# Patient Record
Sex: Female | Born: 1980
Health system: Southern US, Community
[De-identification: ages and names within clinical notes are randomized; demographics above are authoritative.]

## PROBLEM LIST (undated history)

## (undated) DIAGNOSIS — R079 Chest pain, unspecified: Secondary | ICD-10-CM

## (undated) DIAGNOSIS — M549 Dorsalgia, unspecified: Secondary | ICD-10-CM

## (undated) DIAGNOSIS — J45909 Unspecified asthma, uncomplicated: Secondary | ICD-10-CM

## (undated) DIAGNOSIS — M797 Fibromyalgia: Secondary | ICD-10-CM

## (undated) DIAGNOSIS — F329 Major depressive disorder, single episode, unspecified: Secondary | ICD-10-CM

## (undated) DIAGNOSIS — R600 Localized edema: Secondary | ICD-10-CM

## (undated) DIAGNOSIS — E559 Vitamin D deficiency, unspecified: Secondary | ICD-10-CM

## (undated) DIAGNOSIS — F32A Depression, unspecified: Secondary | ICD-10-CM

## (undated) DIAGNOSIS — F419 Anxiety disorder, unspecified: Secondary | ICD-10-CM

## (undated) DIAGNOSIS — F909 Attention-deficit hyperactivity disorder, unspecified type: Secondary | ICD-10-CM

## (undated) DIAGNOSIS — E039 Hypothyroidism, unspecified: Secondary | ICD-10-CM

## (undated) HISTORY — DX: Vitamin D deficiency, unspecified: E55.9

## (undated) HISTORY — DX: Chest pain, unspecified: R07.9

## (undated) HISTORY — DX: Unspecified asthma, uncomplicated: J45.909

## (undated) HISTORY — DX: Dorsalgia, unspecified: M54.9

## (undated) HISTORY — DX: Attention-deficit hyperactivity disorder, unspecified type: F90.9

## (undated) HISTORY — DX: Localized edema: R60.0

## (undated) HISTORY — DX: Fibromyalgia: M79.7

---

## 2004-03-01 ENCOUNTER — Emergency Department (HOSPITAL_COMMUNITY): Admission: EM | Admit: 2004-03-01 | Discharge: 2004-03-01 | Payer: Self-pay | Admitting: Emergency Medicine

## 2007-09-11 ENCOUNTER — Inpatient Hospital Stay (HOSPITAL_COMMUNITY): Admission: AD | Admit: 2007-09-11 | Discharge: 2007-09-11 | Payer: Self-pay | Admitting: Obstetrics & Gynecology

## 2007-10-04 ENCOUNTER — Inpatient Hospital Stay (HOSPITAL_COMMUNITY): Admission: RE | Admit: 2007-10-04 | Discharge: 2007-10-06 | Payer: Self-pay | Admitting: Obstetrics

## 2011-03-10 LAB — CBC
HCT: 28.7 — ABNORMAL LOW
MCHC: 34.3
MCV: 82.4
Platelets: 176
Platelets: 211
RBC: 4.21
RDW: 15.3
RDW: 15.3

## 2011-03-10 LAB — RPR: RPR Ser Ql: NONREACTIVE

## 2011-03-27 ENCOUNTER — Other Ambulatory Visit: Payer: Self-pay | Admitting: Family Medicine

## 2011-03-27 DIAGNOSIS — E010 Iodine-deficiency related diffuse (endemic) goiter: Secondary | ICD-10-CM

## 2011-04-03 ENCOUNTER — Other Ambulatory Visit: Payer: Self-pay

## 2012-06-15 NOTE — L&D Delivery Note (Signed)
Delivery Note At 3:20 AM a viable female was delivered via Vaginal, Spontaneous Delivery (Presentation:direct ; Occiput Anterior).  APGAR: 5, 7; weight 7 lb 11.8 oz (3510 g).   Placenta status: Intact, Spontaneous.  Cord: 3 vessels with the following complications: None.  Cord pH: not sent.  Tight triple nuchal cord.  Shoulders were delivering cord was being cut.  Baby had a spontaneous cry, urinated and was placed skin to skin.  Tone good but baby was not pink and was not crying.  Placed at bedside for stimulation and assessment by RN.  Heart rate, tone and respiration was good but he still did not cry.  Appeared comfortable when placed skin to skin.  Made small noises but not loud cries.  Remained pink.  Placenta delivered intact with marginal insertion of cord.  Anesthesia: Epidural  Episiotomy: None Lacerations: 2nd degree lacertion Suture Repair: 2.0 3.0 chromic Est. Blood Loss (mL): 250   Mom to postpartum.  Baby to Couplet care / Skin to Skin.  Pt and FOB consented for elective circumcision.  Consent signed.  Ayame Rena 05/03/2013, 3:50 AM

## 2012-09-01 ENCOUNTER — Emergency Department (HOSPITAL_COMMUNITY)
Admission: EM | Admit: 2012-09-01 | Discharge: 2012-09-01 | Disposition: A | Discharge status: Home / Self Care | Payer: Self-pay | Attending: Emergency Medicine | Admitting: Emergency Medicine

## 2012-09-01 ENCOUNTER — Emergency Department (HOSPITAL_COMMUNITY): Payer: Self-pay

## 2012-09-01 ENCOUNTER — Encounter (HOSPITAL_COMMUNITY): Payer: Self-pay | Admitting: Emergency Medicine

## 2012-09-01 DIAGNOSIS — R072 Precordial pain: Secondary | ICD-10-CM | POA: Insufficient documentation

## 2012-09-01 DIAGNOSIS — Z79899 Other long term (current) drug therapy: Secondary | ICD-10-CM | POA: Insufficient documentation

## 2012-09-01 DIAGNOSIS — Z862 Personal history of diseases of the blood and blood-forming organs and certain disorders involving the immune mechanism: Secondary | ICD-10-CM | POA: Insufficient documentation

## 2012-09-01 DIAGNOSIS — Z3202 Encounter for pregnancy test, result negative: Secondary | ICD-10-CM | POA: Insufficient documentation

## 2012-09-01 DIAGNOSIS — F3289 Other specified depressive episodes: Secondary | ICD-10-CM | POA: Insufficient documentation

## 2012-09-01 DIAGNOSIS — F329 Major depressive disorder, single episode, unspecified: Secondary | ICD-10-CM | POA: Insufficient documentation

## 2012-09-01 DIAGNOSIS — F411 Generalized anxiety disorder: Secondary | ICD-10-CM | POA: Insufficient documentation

## 2012-09-01 DIAGNOSIS — Z8639 Personal history of other endocrine, nutritional and metabolic disease: Secondary | ICD-10-CM | POA: Insufficient documentation

## 2012-09-01 HISTORY — DX: Depression, unspecified: F32.A

## 2012-09-01 HISTORY — DX: Major depressive disorder, single episode, unspecified: F32.9

## 2012-09-01 HISTORY — DX: Hypothyroidism, unspecified: E03.9

## 2012-09-01 HISTORY — DX: Anxiety disorder, unspecified: F41.9

## 2012-09-01 LAB — COMPREHENSIVE METABOLIC PANEL
AST: 19 U/L (ref 0–37)
Albumin: 4 g/dL (ref 3.5–5.2)
Alkaline Phosphatase: 51 U/L (ref 39–117)
BUN: 15 mg/dL (ref 6–23)
Chloride: 102 mEq/L (ref 96–112)
Potassium: 4.2 mEq/L (ref 3.5–5.1)
Total Bilirubin: 0.4 mg/dL (ref 0.3–1.2)

## 2012-09-01 LAB — CBC WITH DIFFERENTIAL/PLATELET
Basophils Absolute: 0 10*3/uL (ref 0.0–0.1)
Basophils Relative: 0 % (ref 0–1)
Hemoglobin: 12.6 g/dL (ref 12.0–15.0)
Lymphocytes Relative: 27 % (ref 12–46)
MCHC: 34.3 g/dL (ref 30.0–36.0)
Monocytes Relative: 5 % (ref 3–12)
Neutro Abs: 7.1 10*3/uL (ref 1.7–7.7)
Neutrophils Relative %: 66 % (ref 43–77)
WBC: 10.8 10*3/uL — ABNORMAL HIGH (ref 4.0–10.5)

## 2012-09-01 NOTE — ED Notes (Signed)
Pt st's she has had upper chest pain off and on for almost 3 yrs.  St's it feels like gas in her chest.  Pt st's she also has been having night sweats.

## 2012-09-01 NOTE — ED Provider Notes (Signed)
History     CSN: [id number]  Arrival date & time 09/01/12  8469   First MD Initiated Contact with Patient 09/01/12 2241      Chief Complaint  Patient presents with  . Chest Pain    (Consider location/radiation/quality/duration/timing/severity/associated sxs/prior treatment) Patient is a 32 y.o. female presenting with chest pain. The history is provided by the patient.  Chest Pain Pain location:  Substernal area Pain radiates to:  Does not radiate Pain radiates to the back: no   Associated symptoms: no abdominal pain, no back pain, no fever, no nausea, no shortness of breath, not vomiting and no weakness   Associated symptoms comment:  She has had sharp chest pain that starts in the epigastrium and feels like "gas pain". It has been on and off for the past 3 years. She is here tonight at the urging of her family because of family history of heart disease including the death of her father at age 63. She denies SOB, N, V. She is currently being evaluated by Dr. Sharl Ma for endocrine disorders secondary to weight gain, heat intolerance, and hair loss.    Past Medical History  Diagnosis Date  . Anxiety   . Hypothyroid   . Depression     History reviewed. No pertinent past surgical history.  No family history on file.  History  Substance Use Topics  . Smoking status: Never Smoker   . Smokeless tobacco: Not on file  . Alcohol Use: No    OB History   Grav Para Term Preterm Abortions TAB SAB Ect Mult Living                  Review of Systems  Constitutional: Negative for fever.  HENT: Negative for neck pain.   Respiratory: Negative for shortness of breath.   Cardiovascular: Positive for chest pain.  Gastrointestinal: Negative for nausea, vomiting and abdominal pain.  Genitourinary: Negative for dysuria.  Musculoskeletal: Negative for back pain.  Neurological: Negative for weakness.    Allergies  Review of patient's allergies indicates no known allergies.  Home  Medications   Current Outpatient Rx  Name  Route  Sig  Dispense  Refill  . ALPRAZolam (XANAX) 0.25 MG tablet   Oral   Take 0.25 mg by mouth 2 (two) times daily as needed for anxiety.         . cholecalciferol (VITAMIN D) 1000 UNITS tablet   Oral   Take 1,000 Units by mouth daily.         Marland Kitchen ibuprofen (ADVIL,MOTRIN) 200 MG tablet   Oral   Take 400 mg by mouth 2 (two) times daily as needed for pain.         . Multiple Vitamin (MULTIVITAMIN WITH MINERALS) TABS   Oral   Take by mouth daily. 4 chewables daily is one dose per bottle         . Naphazoline HCl (NAPHCON OP)   Both Eyes   Place 1-2 drops into both eyes daily as needed (for itchy eyes).         . sertraline (ZOLOFT) 25 MG tablet   Oral   Take 25 mg by mouth daily.           BP 113/73  Pulse 77  Temp(Src) 98.1 F (36.7 C) (Oral)  Resp 19  SpO2 100%  LMP 08/04/2012  Physical Exam  Constitutional: She is oriented to person, place, and time. She appears well-developed and well-nourished.  HENT:  Head: Normocephalic.  Neck: Normal range of motion. Neck supple.  Cardiovascular: Normal rate and regular rhythm.   No murmur heard. Pulmonary/Chest: Effort normal and breath sounds normal.  Abdominal: Soft. Bowel sounds are normal. There is no tenderness. There is no rebound and no guarding.  Musculoskeletal: Normal range of motion. She exhibits no edema.  Neurological: She is alert and oriented to person, place, and time.  Skin: Skin is warm and dry. No rash noted.  Psychiatric: She has a normal mood and affect.    ED Course  Procedures (including critical care time)  Labs Reviewed  CBC WITH DIFFERENTIAL - Abnormal; Notable for the following:    WBC 10.8 (*)    All other components within normal limits  COMPREHENSIVE METABOLIC PANEL - Abnormal; Notable for the following:    GFR calc non Af Amer 77 (*)    GFR calc Af Amer 89 (*)    All other components within normal limits  D-DIMER, QUANTITATIVE   POCT PREGNANCY, URINE  POCT I-STAT TROPONIN I   Results for orders placed during the hospital encounter of 09/01/12  CBC WITH DIFFERENTIAL      Result Value Range   WBC 10.8 (*) 4.0 - 10.5 K/uL   RBC 4.44  3.87 - 5.11 MIL/uL   Hemoglobin 12.6  12.0 - 15.0 g/dL   HCT 46.9  62.9 - 52.8 %   MCV 82.7  78.0 - 100.0 fL   MCH 28.4  26.0 - 34.0 pg   MCHC 34.3  30.0 - 36.0 g/dL   RDW 41.3  24.4 - 01.0 %   Platelets 261  150 - 400 K/uL   Neutrophils Relative 66  43 - 77 %   Neutro Abs 7.1  1.7 - 7.7 K/uL   Lymphocytes Relative 27  12 - 46 %   Lymphs Abs 3.0  0.7 - 4.0 K/uL   Monocytes Relative 5  3 - 12 %   Monocytes Absolute 0.5  0.1 - 1.0 K/uL   Eosinophils Relative 2  0 - 5 %   Eosinophils Absolute 0.2  0.0 - 0.7 K/uL   Basophils Relative 0  0 - 1 %   Basophils Absolute 0.0  0.0 - 0.1 K/uL  COMPREHENSIVE METABOLIC PANEL      Result Value Range   Sodium 138  135 - 145 mEq/L   Potassium 4.2  3.5 - 5.1 mEq/L   Chloride 102  96 - 112 mEq/L   CO2 25  19 - 32 mEq/L   Glucose, Bld 95  70 - 99 mg/dL   BUN 15  6 - 23 mg/dL   Creatinine, Ser 2.72  0.50 - 1.10 mg/dL   Calcium 9.3  8.4 - 53.6 mg/dL   Total Protein 7.4  6.0 - 8.3 g/dL   Albumin 4.0  3.5 - 5.2 g/dL   AST 19  0 - 37 U/L   ALT 18  0 - 35 U/L   Alkaline Phosphatase 51  39 - 117 U/L   Total Bilirubin 0.4  0.3 - 1.2 mg/dL   GFR calc non Af Amer 77 (*) >90 mL/min   GFR calc Af Amer 89 (*) >90 mL/min  D-DIMER, QUANTITATIVE      Result Value Range   D-Dimer, Quant 0.34  0.00 - 0.48 ug/mL-FEU  POCT PREGNANCY, URINE      Result Value Range   Preg Test, Ur NEGATIVE  NEGATIVE  POCT I-STAT TROPONIN I      Result Value Range  Troponin i, poc 0.00  0.00 - 0.08 ng/mL   Comment 3            Date: 09/01/2012  Rate: 90  Rhythm: normal sinus rhythm  QRS Axis: normal  Intervals: normal  ST/T Wave abnormalities: nonspecific T wave changes  Conduction Disutrbances:none  Narrative Interpretation:   Old EKG Reviewed: none  available    Dg Chest 2 View  09/01/2012  *RADIOLOGY REPORT*  Clinical Data: Chest pain.  CHEST - 2 VIEW  Comparison: None.  Findings: Two views of the chest demonstrate clear lungs. Heart and mediastinum are within normal limits.  The trachea is midline. Bony thorax is intact.  IMPRESSION: No acute cardiopulmonary disease.   Original Report Authenticated By: Richarda Overlie, M.D.      No diagnosis found. 1. Chest pain    MDM  Chronic chest pain for years with presentation atypical of cardiac chest pain, negative work up here. VSS. Stable for discharge home.        Arnoldo Hooker, PA-C 09/01/12 2312

## 2012-09-04 NOTE — ED Provider Notes (Signed)
Medical screening examination/treatment/procedure(s) were performed by non-physician practitioner and as supervising physician I was immediately available for consultation/collaboration.   Math Brazie M Jajaira Ruis, DO 09/04/12 0114 

## 2012-10-15 LAB — OB RESULTS CONSOLE ABO/RH: RH Type: POSITIVE

## 2012-10-15 LAB — OB RESULTS CONSOLE RUBELLA ANTIBODY, IGM: Rubella: IMMUNE

## 2012-10-15 LAB — OB RESULTS CONSOLE HIV ANTIBODY (ROUTINE TESTING): HIV: NONREACTIVE

## 2012-10-15 LAB — OB RESULTS CONSOLE ANTIBODY SCREEN: Antibody Screen: NEGATIVE

## 2012-10-24 ENCOUNTER — Other Ambulatory Visit (HOSPITAL_COMMUNITY)
Admission: RE | Admit: 2012-10-24 | Discharge: 2012-10-24 | Disposition: A | Discharge status: Home / Self Care | Payer: 59 | Source: Ambulatory Visit | Attending: Obstetrics and Gynecology | Admitting: Obstetrics and Gynecology

## 2012-10-24 ENCOUNTER — Other Ambulatory Visit: Payer: Self-pay | Admitting: Obstetrics and Gynecology

## 2012-10-24 DIAGNOSIS — Z113 Encounter for screening for infections with a predominantly sexual mode of transmission: Secondary | ICD-10-CM | POA: Insufficient documentation

## 2012-10-24 DIAGNOSIS — Z1151 Encounter for screening for human papillomavirus (HPV): Secondary | ICD-10-CM | POA: Insufficient documentation

## 2012-10-24 DIAGNOSIS — Z01419 Encounter for gynecological examination (general) (routine) without abnormal findings: Secondary | ICD-10-CM | POA: Insufficient documentation

## 2013-04-17 LAB — OB RESULTS CONSOLE GBS: GBS: NEGATIVE

## 2013-05-02 ENCOUNTER — Inpatient Hospital Stay (HOSPITAL_COMMUNITY)
Admission: AD | Admit: 2013-05-02 | Discharge: 2013-05-05 | DRG: 775 | Disposition: A | Discharge status: Home / Self Care | Payer: 59 | Source: Ambulatory Visit | Attending: Obstetrics and Gynecology | Admitting: Obstetrics and Gynecology

## 2013-05-02 ENCOUNTER — Encounter (HOSPITAL_COMMUNITY): Payer: Self-pay | Admitting: *Deleted

## 2013-05-02 ENCOUNTER — Inpatient Hospital Stay (HOSPITAL_COMMUNITY): Payer: 59 | Admitting: Anesthesiology

## 2013-05-02 ENCOUNTER — Encounter (HOSPITAL_COMMUNITY): Payer: 59 | Admitting: Anesthesiology

## 2013-05-02 DIAGNOSIS — O409XX0 Polyhydramnios, unspecified trimester, not applicable or unspecified: Secondary | ICD-10-CM | POA: Diagnosis present

## 2013-05-02 DIAGNOSIS — F329 Major depressive disorder, single episode, unspecified: Secondary | ICD-10-CM | POA: Diagnosis present

## 2013-05-02 DIAGNOSIS — E039 Hypothyroidism, unspecified: Secondary | ICD-10-CM | POA: Diagnosis present

## 2013-05-02 DIAGNOSIS — E079 Disorder of thyroid, unspecified: Secondary | ICD-10-CM | POA: Diagnosis present

## 2013-05-02 DIAGNOSIS — O99344 Other mental disorders complicating childbirth: Secondary | ICD-10-CM | POA: Diagnosis present

## 2013-05-02 DIAGNOSIS — F411 Generalized anxiety disorder: Secondary | ICD-10-CM | POA: Diagnosis present

## 2013-05-02 LAB — CBC
HCT: 36.7 % (ref 36.0–46.0)
Hemoglobin: 12.4 g/dL (ref 12.0–15.0)
MCH: 26.2 pg (ref 26.0–34.0)
MCHC: 33.8 g/dL (ref 30.0–36.0)
MCV: 77.6 fL — ABNORMAL LOW (ref 78.0–100.0)
RDW: 15 % (ref 11.5–15.5)

## 2013-05-02 LAB — URINALYSIS, ROUTINE W REFLEX MICROSCOPIC
Glucose, UA: NEGATIVE mg/dL
Specific Gravity, Urine: 1.025 (ref 1.005–1.030)
pH: 6 (ref 5.0–8.0)

## 2013-05-02 LAB — URINE MICROSCOPIC-ADD ON

## 2013-05-02 MED ORDER — EPHEDRINE 5 MG/ML INJ
10.0000 mg | INTRAVENOUS | Status: DC | PRN
  Filled 2013-05-02: qty 2
  Filled 2013-05-02: qty 4

## 2013-05-02 MED ORDER — FLEET ENEMA 7-19 GM/118ML RE ENEM: 1.0000 | ENEMA | RECTAL | Status: DC | PRN

## 2013-05-02 MED ORDER — PHENYLEPHRINE 40 MCG/ML (10ML) SYRINGE FOR IV PUSH (FOR BLOOD PRESSURE SUPPORT)
80.0000 ug | PREFILLED_SYRINGE | INTRAVENOUS | Status: DC | PRN
  Filled 2013-05-02: qty 10
  Filled 2013-05-02: qty 2

## 2013-05-02 MED ORDER — LIDOCAINE HCL (PF) 1 % IJ SOLN
30.0000 mL | INTRAMUSCULAR | Status: DC | PRN
  Filled 2013-05-02 (×2): qty 30

## 2013-05-02 MED ORDER — LACTATED RINGERS IV SOLN
500.0000 mL | INTRAVENOUS | Status: DC | PRN
Start: 2013-05-02 — End: 2013-05-03
  Administered 2013-05-02: 1000 mL via INTRAVENOUS

## 2013-05-02 MED ORDER — ONDANSETRON HCL 4 MG/2ML IJ SOLN
4.0000 mg | Freq: Four times a day (QID) | INTRAMUSCULAR | Status: DC | PRN
  Administered 2013-05-02: 4 mg via INTRAVENOUS
  Filled 2013-05-02: qty 2

## 2013-05-02 MED ORDER — FENTANYL 2.5 MCG/ML BUPIVACAINE 1/10 % EPIDURAL INFUSION (WH - ANES)
INTRAMUSCULAR | Status: DC | PRN
  Administered 2013-05-02: 14 mL/h via EPIDURAL

## 2013-05-02 MED ORDER — ACETAMINOPHEN 325 MG PO TABS: 650.0000 mg | ORAL_TABLET | ORAL | Status: DC | PRN

## 2013-05-02 MED ORDER — EPHEDRINE 5 MG/ML INJ
10.0000 mg | INTRAVENOUS | Status: AC | PRN
  Administered 2013-05-02 (×2): 10 mg via INTRAVENOUS

## 2013-05-02 MED ORDER — IBUPROFEN 600 MG PO TABS
600.0000 mg | ORAL_TABLET | Freq: Four times a day (QID) | ORAL | Status: DC | PRN
  Administered 2013-05-03: 600 mg via ORAL
  Filled 2013-05-02: qty 1

## 2013-05-02 MED ORDER — LIDOCAINE HCL (PF) 1 % IJ SOLN
INTRAMUSCULAR | Status: DC | PRN
  Administered 2013-05-02 (×2): 9 mL

## 2013-05-02 MED ORDER — OXYCODONE-ACETAMINOPHEN 5-325 MG PO TABS
1.0000 | ORAL_TABLET | ORAL | Status: DC | PRN
  Administered 2013-05-03: 1 via ORAL
  Filled 2013-05-02: qty 1

## 2013-05-02 MED ORDER — DIPHENHYDRAMINE HCL 50 MG/ML IJ SOLN: 12.5000 mg | INTRAMUSCULAR | Status: DC | PRN

## 2013-05-02 MED ORDER — OXYTOCIN 40 UNITS IN LACTATED RINGERS INFUSION - SIMPLE MED
62.5000 mL/h | INTRAVENOUS | Status: DC
  Filled 2013-05-02: qty 1000

## 2013-05-02 MED ORDER — LACTATED RINGERS IV SOLN: INTRAVENOUS | Status: DC

## 2013-05-02 MED ORDER — OXYTOCIN BOLUS FROM INFUSION: 500.0000 mL | INTRAVENOUS | Status: DC

## 2013-05-02 MED ORDER — FENTANYL 2.5 MCG/ML BUPIVACAINE 1/10 % EPIDURAL INFUSION (WH - ANES)
14.0000 mL/h | INTRAMUSCULAR | Status: DC | PRN
  Filled 2013-05-02: qty 125

## 2013-05-02 MED ORDER — PHENYLEPHRINE 40 MCG/ML (10ML) SYRINGE FOR IV PUSH (FOR BLOOD PRESSURE SUPPORT)
80.0000 ug | PREFILLED_SYRINGE | INTRAVENOUS | Status: DC | PRN
  Filled 2013-05-02: qty 2

## 2013-05-02 MED ORDER — CITRIC ACID-SODIUM CITRATE 334-500 MG/5ML PO SOLN: 30.0000 mL | ORAL | Status: DC | PRN

## 2013-05-02 MED ORDER — LACTATED RINGERS IV SOLN: 500.0000 mL | Freq: Once | INTRAVENOUS | Status: DC

## 2013-05-02 NOTE — Progress Notes (Signed)
  Subjective: Called to [facility] for prolonged decel.  BP dropped after epidural.  Prior to entering the room, position change, bolus, and Ephedrine x 2 dose (10 mg). FHT resolved back to baseline after interventions.  Objective: BP 142/91  Pulse 96  Temp(Src) 97.9 F (36.6 C) (Oral)  Resp 20  Ht 5\' 2"  (1.575 m)  Wt 243 lb (110.224 kg)  BMI 44.43 kg/m2  SpO2 100%  LMP 08/04/2012      FHT:  FHR: 150 bpm, variability: moderate,  accelerations:  Present,  decelerations:  Present prolonged decel at 2254 down to 80 for 6 min with resolution back to baseline with moderate variablility.   Assessment / Plan: Prolonged decel  Report to Dr. Sharlene Motts, Bonnell Placzek 05/02/2013, 11:47 PM

## 2013-05-02 NOTE — Anesthesia Preprocedure Evaluation (Signed)
Anesthesia Evaluation  Patient identified by MRN, date of birth, ID band Patient awake    Reviewed: Allergy & Precautions, H&P , NPO status , Patient's Chart, lab work & pertinent test results  Airway Mallampati: II TM Distance: >3 FB Neck ROM: full    Dental no notable dental hx.    Pulmonary neg pulmonary ROS,  breath sounds clear to auscultation  Pulmonary exam normal       Cardiovascular negative cardio ROS  Rhythm:regular Rate:Normal     Neuro/Psych PSYCHIATRIC DISORDERS Anxiety Depression negative neurological ROS     GI/Hepatic negative GI ROS, Neg liver ROS,   Endo/Other  Hypothyroidism Morbid obesity  Renal/GU negative Renal ROS  negative genitourinary   Musculoskeletal negative musculoskeletal ROS (+)   Abdominal   Peds  Hematology negative hematology ROS (+)   Anesthesia Other Findings   Reproductive/Obstetrics (+) Pregnancy                           Anesthesia Physical Anesthesia Plan  ASA: III  Anesthesia Plan: Epidural   Post-op Pain Management:    Induction:   Airway Management Planned:   Additional Equipment:   Intra-op Plan:   Post-operative Plan:   Informed Consent: I have reviewed the patients History and Physical, chart, labs and discussed the procedure including the risks, benefits and alternatives for the proposed anesthesia with the patient or authorized representative who has indicated his/her understanding and acceptance.     Plan Discussed with:   Anesthesia Plan Comments:         Anesthesia Quick Evaluation

## 2013-05-02 NOTE — MAU Note (Signed)
Pt was seen in the office today for ultrasound and reg appt, VE was 4 cm per pt.  Pt states U/C started about 1830 tonight and is every 4-5 minutes.  Denies vaginal bleeding or ROM.  Good fetal movement.

## 2013-05-02 NOTE — Anesthesia Procedure Notes (Signed)
Epidural Patient location during procedure: OB Start time: 05/02/2013 10:35 PM End time: 05/02/2013 10:39 PM  Staffing Anesthesiologist: Leilani Able Performed by: anesthesiologist   Preanesthetic Checklist Completed: patient identified, surgical consent, pre-op evaluation, timeout performed, IV checked, risks and benefits discussed and monitors and equipment checked  Epidural Patient position: sitting Prep: site prepped and draped and DuraPrep Patient monitoring: continuous pulse ox and blood pressure Approach: midline Injection technique: LOR air  Needle:  Needle type: Tuohy  Needle gauge: 17 G Needle length: 9 cm and 9 Needle insertion depth: 7 cm Catheter type: closed end flexible Catheter size: 19 Gauge Catheter at skin depth: 11 cm Test dose: negative and Other  Assessment Sensory level: T9 Events: blood not aspirated, injection not painful, no injection resistance, negative IV test and no paresthesia  Additional Notes Reason for block:procedure for pain

## 2013-05-03 ENCOUNTER — Encounter (HOSPITAL_COMMUNITY): Payer: Self-pay | Admitting: *Deleted

## 2013-05-03 LAB — URINE CULTURE
Colony Count: NO GROWTH
Culture: NO GROWTH

## 2013-05-03 MED ORDER — SENNOSIDES-DOCUSATE SODIUM 8.6-50 MG PO TABS
2.0000 | ORAL_TABLET | ORAL | Status: DC
  Administered 2013-05-03 – 2013-05-04 (×3): 2 via ORAL
  Filled 2013-05-03: qty 1
  Filled 2013-05-03 (×2): qty 2

## 2013-05-03 MED ORDER — LANOLIN HYDROUS EX OINT: TOPICAL_OINTMENT | CUTANEOUS | Status: DC | PRN

## 2013-05-03 MED ORDER — OXYTOCIN 40 UNITS IN LACTATED RINGERS INFUSION - SIMPLE MED: 62.5000 mL/h | INTRAVENOUS | Status: DC | PRN

## 2013-05-03 MED ORDER — DIPHENHYDRAMINE HCL 25 MG PO CAPS: 25.0000 mg | ORAL_CAPSULE | Freq: Four times a day (QID) | ORAL | Status: DC | PRN

## 2013-05-03 MED ORDER — ONDANSETRON HCL 4 MG/2ML IJ SOLN: 4.0000 mg | INTRAMUSCULAR | Status: DC | PRN

## 2013-05-03 MED ORDER — LEVOTHYROXINE SODIUM 25 MCG PO TABS
25.0000 ug | ORAL_TABLET | Freq: Every day | ORAL | Status: DC
Start: 2013-05-03 — End: 2013-05-05
  Administered 2013-05-03 – 2013-05-05 (×3): 25 ug via ORAL
  Filled 2013-05-03 (×4): qty 1

## 2013-05-03 MED ORDER — SERTRALINE HCL 25 MG PO TABS
25.0000 mg | ORAL_TABLET | Freq: Every day | ORAL | Status: DC
  Administered 2013-05-03 – 2013-05-05 (×3): 25 mg via ORAL
  Filled 2013-05-03 (×4): qty 1

## 2013-05-03 MED ORDER — MAGNESIUM HYDROXIDE 400 MG/5ML PO SUSP: 30.0000 mL | ORAL | Status: DC | PRN

## 2013-05-03 MED ORDER — METHYLERGONOVINE MALEATE 0.2 MG/ML IJ SOLN: 0.2000 mg | INTRAMUSCULAR | Status: DC | PRN

## 2013-05-03 MED ORDER — SIMETHICONE 80 MG PO CHEW
80.0000 mg | CHEWABLE_TABLET | ORAL | Status: DC | PRN
  Administered 2013-05-03: 80 mg via ORAL
  Filled 2013-05-03: qty 1

## 2013-05-03 MED ORDER — BENZOCAINE-MENTHOL 20-0.5 % EX AERO
1.0000 "application " | INHALATION_SPRAY | CUTANEOUS | Status: DC | PRN
  Administered 2013-05-03: 1 via TOPICAL
  Filled 2013-05-03 (×2): qty 56

## 2013-05-03 MED ORDER — ZOLPIDEM TARTRATE 5 MG PO TABS: 5.0000 mg | ORAL_TABLET | Freq: Every evening | ORAL | Status: DC | PRN

## 2013-05-03 MED ORDER — METHYLERGONOVINE MALEATE 0.2 MG PO TABS: 0.2000 mg | ORAL_TABLET | ORAL | Status: DC | PRN

## 2013-05-03 MED ORDER — OXYCODONE-ACETAMINOPHEN 5-325 MG PO TABS
1.0000 | ORAL_TABLET | ORAL | Status: DC | PRN
  Administered 2013-05-03 (×2): 1 via ORAL
  Administered 2013-05-04 – 2013-05-05 (×5): 2 via ORAL
  Filled 2013-05-03 (×3): qty 2
  Filled 2013-05-03 (×2): qty 1
  Filled 2013-05-03 (×2): qty 2

## 2013-05-03 MED ORDER — IBUPROFEN 600 MG PO TABS
600.0000 mg | ORAL_TABLET | Freq: Four times a day (QID) | ORAL | Status: DC
  Administered 2013-05-03 – 2013-05-05 (×9): 600 mg via ORAL
  Filled 2013-05-03 (×9): qty 1

## 2013-05-03 MED ORDER — DIBUCAINE 1 % RE OINT: 1.0000 "application " | TOPICAL_OINTMENT | RECTAL | Status: DC | PRN

## 2013-05-03 MED ORDER — ONDANSETRON HCL 4 MG PO TABS: 4.0000 mg | ORAL_TABLET | ORAL | Status: DC | PRN

## 2013-05-03 MED ORDER — TETANUS-DIPHTH-ACELL PERTUSSIS 5-2.5-18.5 LF-MCG/0.5 IM SUSP
0.5000 mL | Freq: Once | INTRAMUSCULAR | Status: AC
  Administered 2013-05-04: 0.5 mL via INTRAMUSCULAR
  Filled 2013-05-03: qty 0.5

## 2013-05-03 MED ORDER — PRENATAL MULTIVITAMIN CH
1.0000 | ORAL_TABLET | Freq: Every day | ORAL | Status: DC
  Administered 2013-05-03 – 2013-05-05 (×3): 1 via ORAL
  Filled 2013-05-03 (×3): qty 1

## 2013-05-03 MED ORDER — WITCH HAZEL-GLYCERIN EX PADS: 1.0000 "application " | MEDICATED_PAD | CUTANEOUS | Status: DC | PRN

## 2013-05-03 MED ORDER — MEASLES, MUMPS & RUBELLA VAC ~~LOC~~ INJ
0.5000 mL | INJECTION | Freq: Once | SUBCUTANEOUS | Status: DC
  Filled 2013-05-03: qty 0.5

## 2013-05-03 NOTE — Lactation Note (Signed)
This note was copied from the chart of Pamela Brieonna Prete. Lactation Consultation Note  Patient Name: Pamela Jennings Date: 05/03/2013 Reason for consult: Initial assessment;Other (Comment) (charting for exclusion)   Maternal Data Formula Feeding for Exclusion: Yes Reason for exclusion: Mother's choice to formula feed on admision  Feeding Feeding Type: Bottle Fed - Formula  LATCH Score/Interventions                      Lactation Tools Discussed/Used     Consult Status Consult Status: Complete    Lynda Rainwater 05/03/2013, 4:48 PM

## 2013-05-03 NOTE — H&P (Signed)
BABS DABBS is a 32 y.o. female G3P2012 at 92 6/7 weeks presenting for c/o contractions.  Every 4-5 minutes on arrival.  Seen in office ~ 6 hours prior to delivery.  Cervical exam was 3 cm.  On arrival to hospital, cervix was 5/80%/-1. Pt received epidural with fetal bradycardia due to hypotension.  SROM with copious amounts of clear fluid, which ran of both sides of the bed.  Maternal Medical History:  Reason for admission: Contractions.   Contractions: Onset was 3-5 hours ago.   Frequency: regular.   Perceived severity is strong.   Prior to admission.  Fetal activity: Perceived fetal activity is normal.   Last perceived fetal movement was within the past hour.    Prenatal complications: no prenatal complications Prenatal Complications - Diabetes: none. 3 hr GTT negative.  OB History   Grav Para Term Preterm Abortions TAB SAB Ect Mult Living   3 2 2  1  1   2      Past Medical History  Diagnosis Date  . Anxiety   . Hypothyroid   . Depression    History reviewed. No pertinent past surgical history. Family History: family history is not on file. Social History:  reports that she has never smoked. She does not have any smokeless tobacco history on file. She reports that she does not drink alcohol or use illicit drugs.   Prenatal Transfer Tool  Maternal Diabetes: No Genetic Screening: Normal Maternal Ultrasounds/Referrals: Normal Fetal Ultrasounds or other Referrals:  None Maternal Substance Abuse:  No Significant Maternal Medications:  Meds include: Zoloft Syntroid Significant Maternal Lab Results:  Lab values include: Group B Strep negative Other Comments:  Clincal polyhydramnios  Review of Systems  Gastrointestinal: Positive for abdominal pain.    Dilation: 10 Effacement (%): 100 Station: +2 Exam by:: LCarpenter,RN Blood pressure 106/61, pulse 101, temperature 98.1 F (36.7 C), temperature source Oral, resp. rate 20, height 5\' 2"  (1.575 m), weight 110.224 kg  (243 lb), last menstrual period 08/04/2012, SpO2 100.00%, unknown if currently breastfeeding. Maternal Exam:  Uterine Assessment: Contraction frequency is regular.   Abdomen: Fetal presentation: vertex  Introitus: Normal vulva. Normal vagina.    Fetal Exam Fetal Monitor Review: Mode: fetoscope.   Variability: moderate (6-25 bpm).   Pattern: variable decelerations.       Physical Exam  Constitutional: She is oriented to person, place, and time. She appears well-developed and well-nourished. No distress.  HENT:  Head: Normocephalic and atraumatic.  Neck: Normal range of motion.  GI: Soft. There is no tenderness.  Musculoskeletal: Normal range of motion.  Neurological: She is alert and oriented to person, place, and time.  Skin: Skin is warm and dry. She is not diaphoretic.    Prenatal labs: ABO, Rh: B/Positive/-- (05/03 0000) Antibody: Negative (05/03 0000) Rubella: Immune (05/03 0000) RPR: NON REACTIVE (11/18 2130)  HBsAg: Negative (05/03 0000)  HIV: Non-reactive (05/03 0000)  GBS: Negative (11/03 0000)   Assessment/Plan: Active labor at 38 6/7 weeks. S/p SVD. H/o hypothyroidism and anxiety.  Continue Synthroid and Zoloft. Circumcision desired.   Jamari Diana 05/03/2013, 4:00 AM

## 2013-05-03 NOTE — Progress Notes (Signed)
Post Partum Day 0 s/p svd  Subjective: no complaints, up ad lib, voiding and tolerating PO  Objective: Blood pressure 112/73, pulse 87, temperature 98.1 F (36.7 C), temperature source Oral, resp. rate 18, height 5\' 2"  (1.575 m), weight 110.224 kg (243 lb), last menstrual period 08/04/2012, SpO2 97.00%, unknown if currently breastfeeding.  Physical Exam:  General: alert and cooperative Lochia: appropriate Uterine Fundus: firm Incision: NA DVT Evaluation: No evidence of DVT seen on physical exam.   Recent Labs  05/02/13 2130  HGB 12.4  HCT 36.7    Assessment/Plan: Breastfeeding and Circumcision prior to discharge  routine postpartum care   plan for circumcision on 05/05/2013 Dr. Charlotta Newton covering 05/04/2013   LOS: 1 day   Kristal Perl J. 05/03/2013, 2:47 PM

## 2013-05-03 NOTE — Progress Notes (Signed)

## 2013-05-03 NOTE — Anesthesia Postprocedure Evaluation (Signed)
  Anesthesia Post-op Note  Patient: Pamela Jennings  Procedure(s) Performed: * No procedures listed *  Patient Location: Mother/Baby  Anesthesia Type:Epidural  Level of Consciousness: awake, alert  and oriented  Airway and Oxygen Therapy: Patient Spontanous Breathing  Post-op Pain: none  Post-op Assessment: Patient's Cardiovascular Status Stable, Respiratory Function Stable, Patent Airway, No signs of Nausea or vomiting, Adequate PO intake, Pain level controlled, No headache, No backache, No residual numbness and No residual motor weakness  Post-op Vital Signs: Reviewed and stable  Complications: No apparent anesthesia complications

## 2013-05-04 LAB — CBC
Hemoglobin: 9.9 g/dL — ABNORMAL LOW (ref 12.0–15.0)
MCH: 26.5 pg (ref 26.0–34.0)
MCHC: 33.3 g/dL (ref 30.0–36.0)
Platelets: 160 10*3/uL (ref 150–400)
RDW: 15.5 % (ref 11.5–15.5)
WBC: 8.1 10*3/uL (ref 4.0–10.5)

## 2013-05-04 MED ORDER — INFLUENZA VAC SPLIT QUAD 0.5 ML IM SUSP
0.5000 mL | INTRAMUSCULAR | Status: AC
  Administered 2013-05-05: 0.5 mL via INTRAMUSCULAR
  Filled 2013-05-04: qty 0.5

## 2013-05-04 MED ORDER — FERROUS FUMARATE 325 (106 FE) MG PO TABS
1.0000 | ORAL_TABLET | Freq: Every day | ORAL | Status: DC
  Administered 2013-05-04 – 2013-05-05 (×2): 106 mg via ORAL
  Filled 2013-05-04 (×3): qty 1

## 2013-05-04 NOTE — Progress Notes (Signed)
Postpartum Day #1  S:  Patient resting comfortable in bed.  Pain improved with Percocet.  Tolerating general diet. No flatus, no BM.  Minimal ambulation. Voiding freely. She denies n/v/f/c, SOB, or CP.  Pt bottle feeding.  Moderate lochia  O: BP 137/78  Pulse 99  Temp(Src) 98.1 F (36.7 C) (Oral)  Resp 16  Ht 5\' 2"  (1.575 m)  Wt 110.224 kg (243 lb)  BMI 44.43 kg/m2  SpO2 97%  LMP 08/04/2012 BP range 112-137/61-83  Gen: A&Ox3, NAD CV: RRR, no MRG Resp: CTAB Abdomen: soft, NT, ND +BS Uterus: firm, non-tender, below umbilicus Ext: 1+ non-pitting edema, no calf tenderness bilaterally  Labs: CBC to be drawn  A/P: Pt is a 32 y.o. [id number] s/p NSVD PPD #1  - Pain well controlled with PO pain management -continue routine postpartum management -Prophylaxis: encourage ambulation -Labs: CBC to be drawn this am, will start iron if Hgb < 9 -Hypothyroidism: continue synthroid daily -Anxiety/Depression: continue zoloft 25mg  daily -Dr. Richardson Dopp to do baby boy circ on 05/05/13  Myna Hidalgo, DO [phone] (pager) [phone] (office)

## 2013-05-05 DIAGNOSIS — F329 Major depressive disorder, single episode, unspecified: Secondary | ICD-10-CM | POA: Diagnosis present

## 2013-05-05 DIAGNOSIS — E039 Hypothyroidism, unspecified: Secondary | ICD-10-CM | POA: Diagnosis present

## 2013-05-05 MED ORDER — IBUPROFEN 600 MG PO TABS: 600.0000 mg | ORAL_TABLET | Freq: Four times a day (QID) | ORAL | Status: DC | PRN

## 2013-05-05 MED ORDER — OXYCODONE-ACETAMINOPHEN 5-325 MG PO TABS: 1.0000 | ORAL_TABLET | Freq: Four times a day (QID) | ORAL | Status: DC | PRN

## 2013-05-05 NOTE — Discharge Summary (Signed)
Obstetric Discharge Summary Reason for Admission: onset of labor Prenatal Procedures: none Intrapartum Procedures: spontaneous vaginal delivery Postpartum Procedures: none Complications-Operative and Postpartum: none Hemoglobin  Date Value Range Status  05/04/2013 9.9* 12.0 - 15.0 g/dL Final     DELTA CHECK NOTED     REPEATED TO VERIFY     HCT  Date Value Range Status  05/04/2013 29.7* 36.0 - 46.0 % Final    Physical Exam:  General: alert and cooperative Lochia: appropriate Uterine Fundus: firm Incision: NA DVT Evaluation: No evidence of DVT seen on physical exam.  Discharge Diagnoses: Term Pregnancy-delivered  Discharge Information: Date: 05/05/2013 Activity: pelvic rest Diet: routine Medications: PNV, Ibuprofen, Percocet and zoloft and synthroid Condition: stable Instructions: refer to practice specific booklet Discharge to: home Follow-up Information   Follow up with Jessee Avers., MD. Schedule an appointment as soon as possible for a visit in 2 weeks. (assess for depression )    Specialty:  Obstetrics and Gynecology   Contact information:   [address] [place] [address] [phone]       Newborn Data: Live born female  Birth Weight: 7 lb 11.8 oz (3510 g) APGAR: 5, 7  Home with mother.  Pamela Jennings J. 05/05/2013, 8:22 AM

## 2013-05-08 ENCOUNTER — Inpatient Hospital Stay (HOSPITAL_COMMUNITY): Admission: RE | Admit: 2013-05-08 | Payer: 59 | Source: Ambulatory Visit

## 2013-05-09 ENCOUNTER — Inpatient Hospital Stay (HOSPITAL_COMMUNITY): Admission: RE | Admit: 2013-05-09 | Payer: 59 | Source: Ambulatory Visit

## 2013-10-20 ENCOUNTER — Institutional Professional Consult (permissible substitution): Payer: 59 | Admitting: Pulmonary Disease

## 2013-11-16 ENCOUNTER — Ambulatory Visit
Admission: RE | Admit: 2013-11-16 | Discharge: 2013-11-16 | Disposition: A | Discharge status: Home / Self Care | Payer: 59 | Source: Ambulatory Visit | Attending: Family Medicine | Admitting: Family Medicine

## 2013-11-16 ENCOUNTER — Other Ambulatory Visit: Payer: Self-pay | Admitting: Family Medicine

## 2013-11-16 DIAGNOSIS — R05 Cough: Secondary | ICD-10-CM

## 2013-11-16 DIAGNOSIS — R053 Chronic cough: Secondary | ICD-10-CM

## 2014-03-09 IMAGING — CR DG CHEST 2V
2 series · 2 of 2 positions shown · non-contrast
Comparison: None.

CLINICAL DATA: Chest pain.

CHEST - 2 VIEW

[w chest pa]
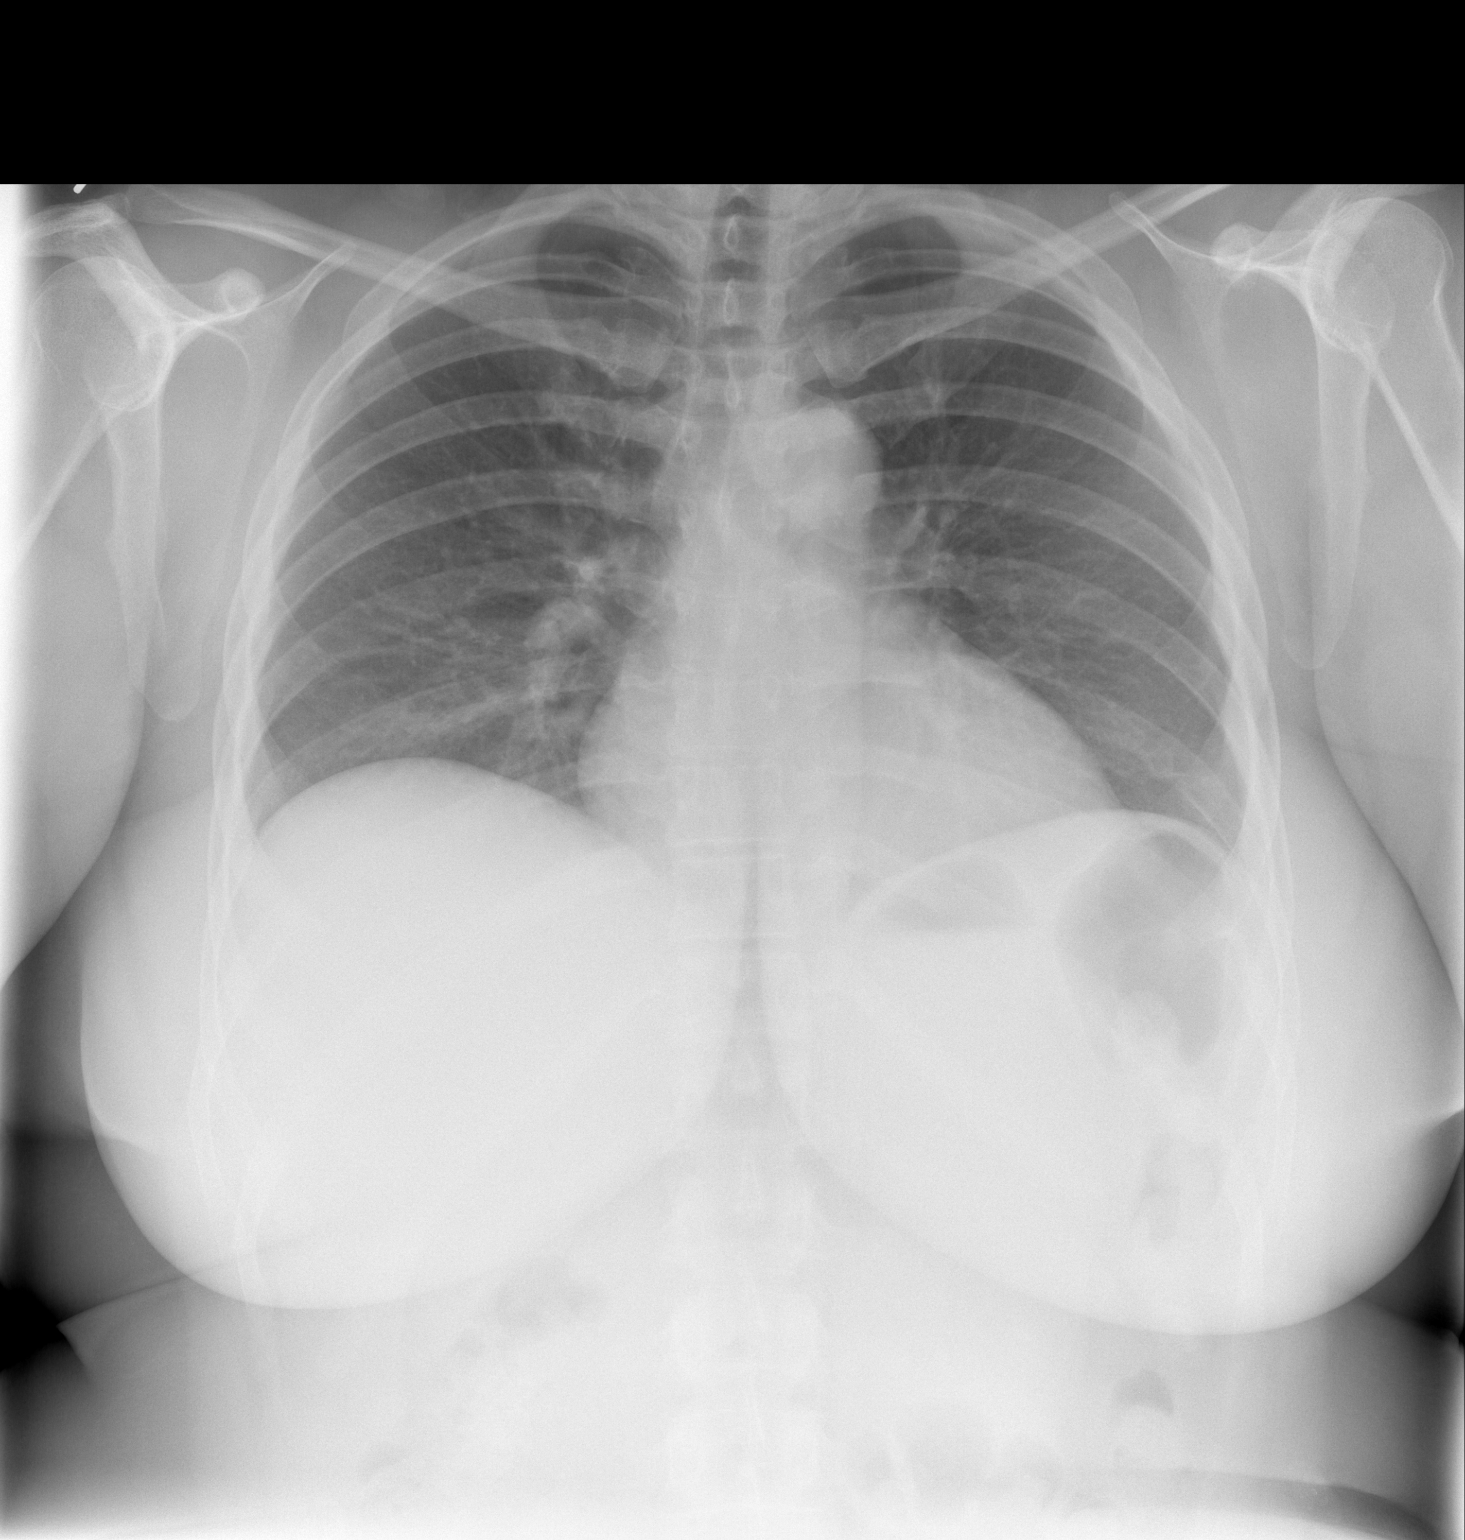

[w chest lat]
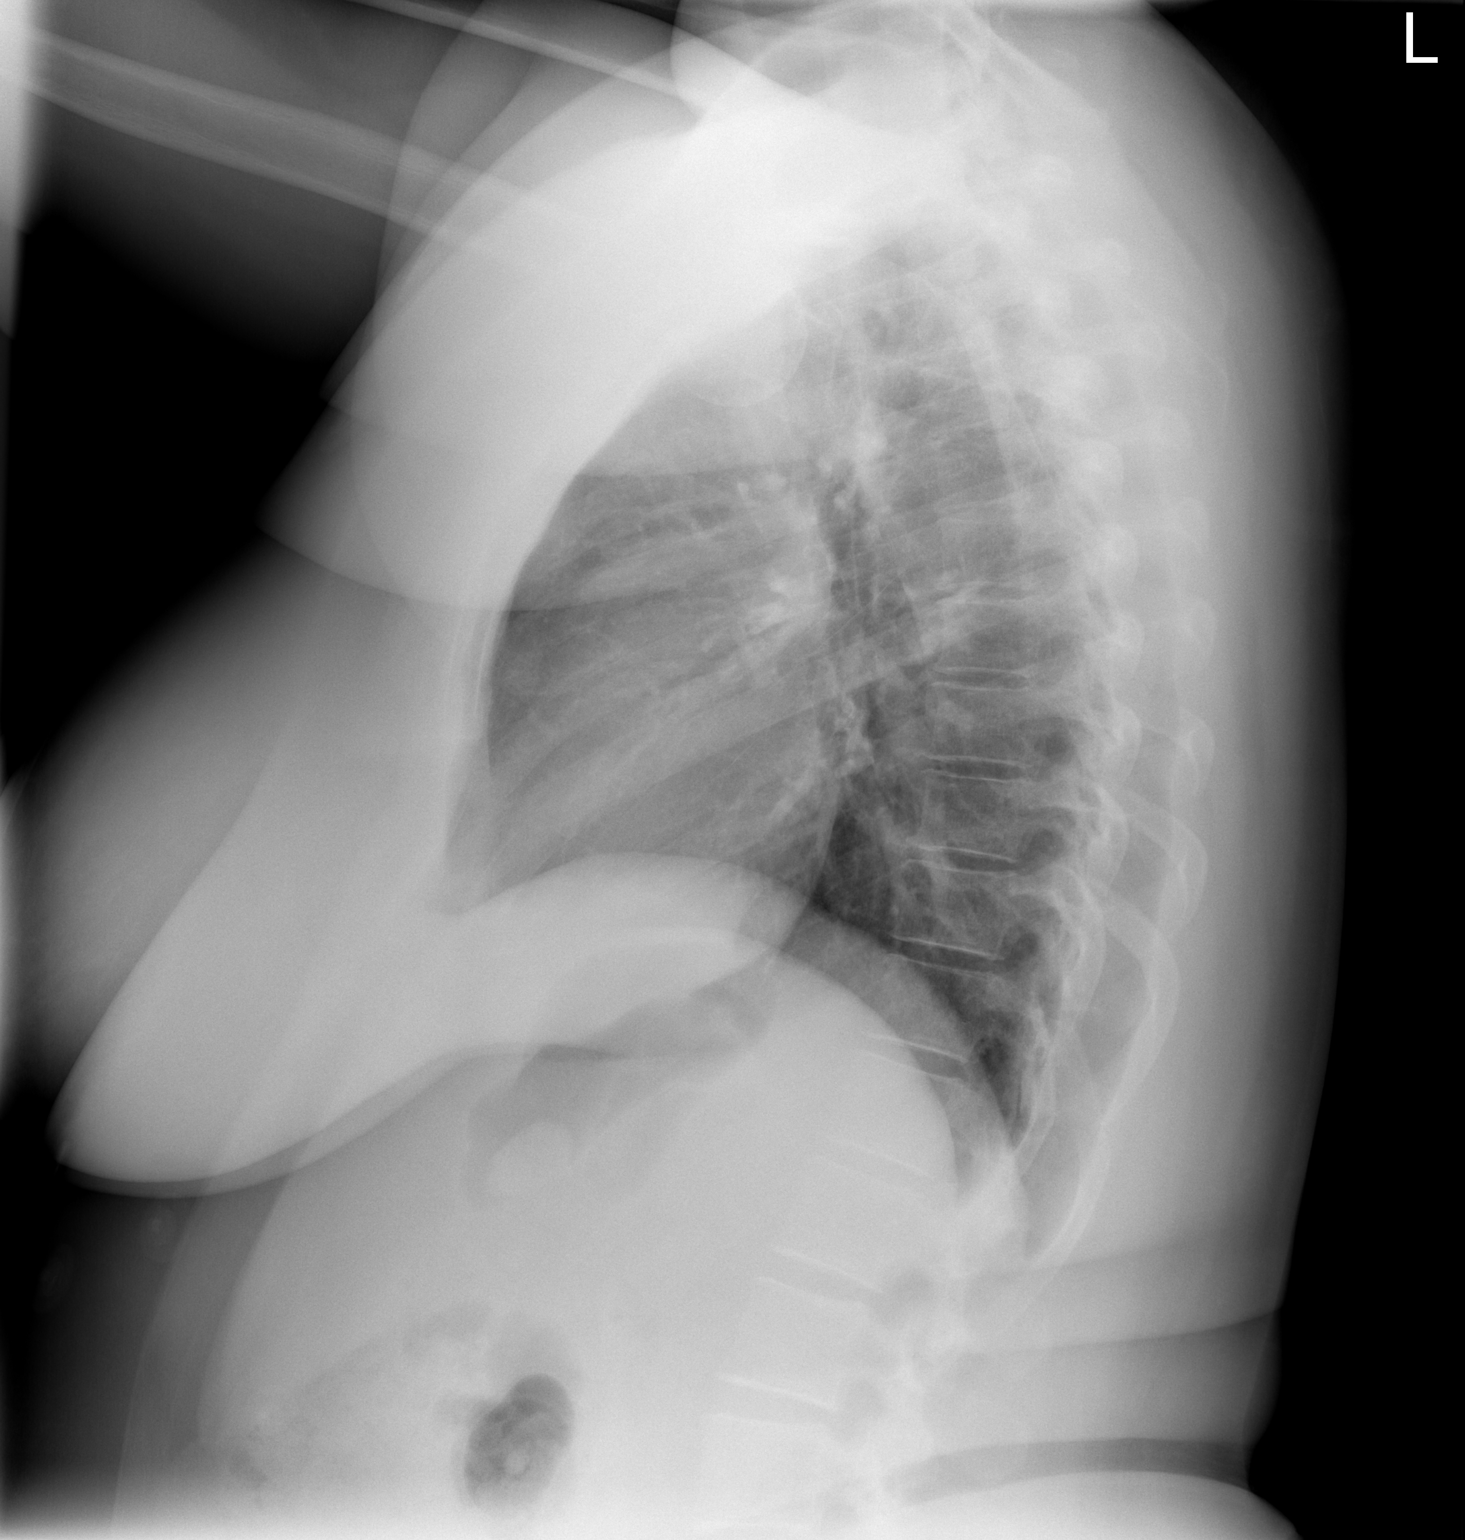

[2 of 2 positions shown; findings below may reference images not displayed]

FINDINGS: Two views of the chest demonstrate clear lungs. Heart and
mediastinum are within normal limits.  The trachea is midline.
Bony thorax is intact.
IMPRESSION: No acute cardiopulmonary disease.

## 2014-04-16 ENCOUNTER — Encounter (HOSPITAL_COMMUNITY): Payer: Self-pay | Admitting: *Deleted

## 2014-05-23 ENCOUNTER — Institutional Professional Consult (permissible substitution): Payer: 59 | Admitting: Internal Medicine

## 2014-06-11 ENCOUNTER — Institutional Professional Consult (permissible substitution): Payer: 59 | Admitting: Internal Medicine

## 2014-06-18 ENCOUNTER — Institutional Professional Consult (permissible substitution): Payer: 59 | Admitting: Internal Medicine

## 2014-06-22 ENCOUNTER — Institutional Professional Consult (permissible substitution): Payer: 59 | Admitting: Internal Medicine

## 2014-07-06 ENCOUNTER — Institutional Professional Consult (permissible substitution): Payer: Self-pay | Admitting: Internal Medicine

## 2015-05-24 IMAGING — CR DG CHEST 2V
2 series · 2 of 2 positions shown · non-contrast
Comparison: Chest x-ray of 09/01/2012

CLINICAL DATA: Persistent cough, back pain

EXAM:
CHEST  2 VIEW

[w chest pa]
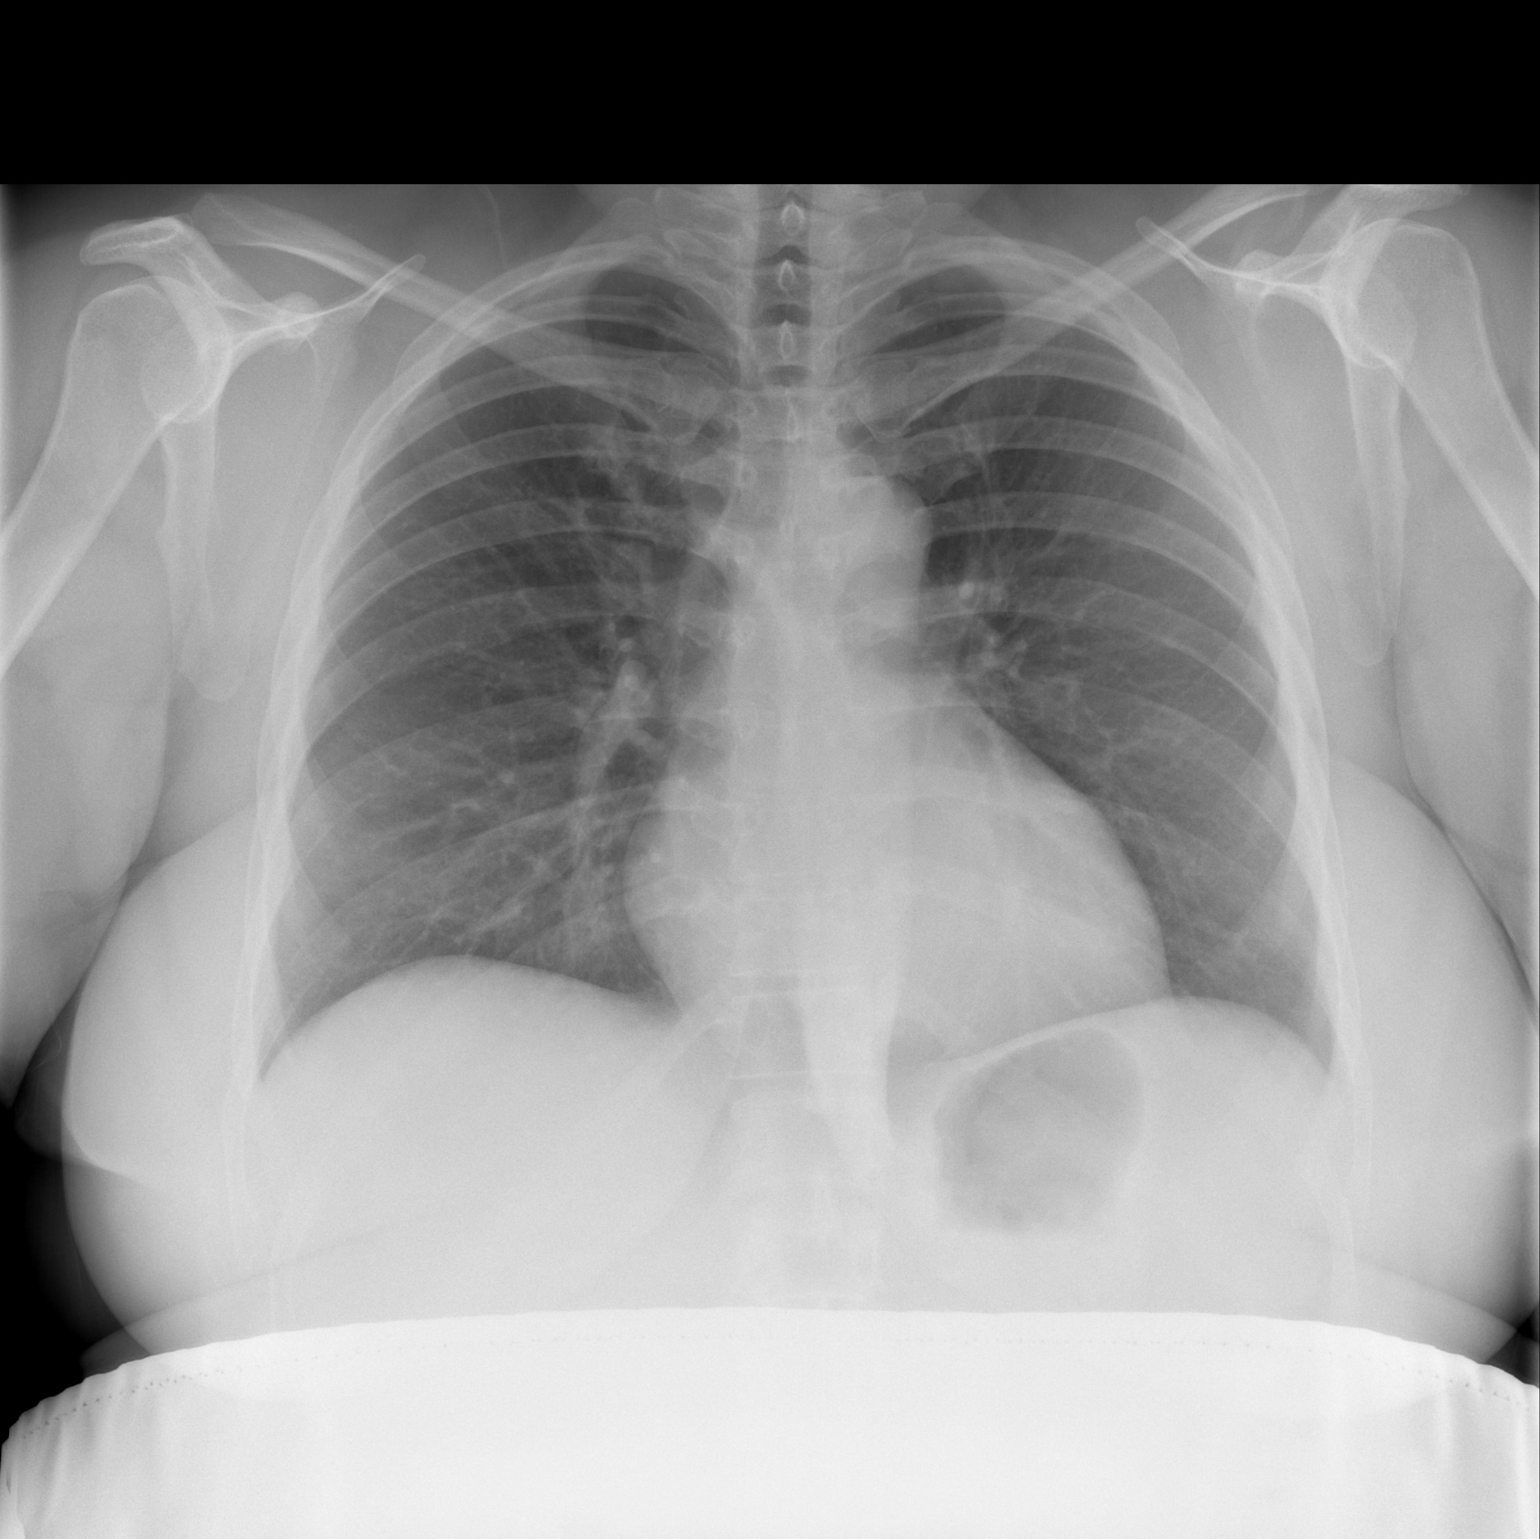

[w chest lat]
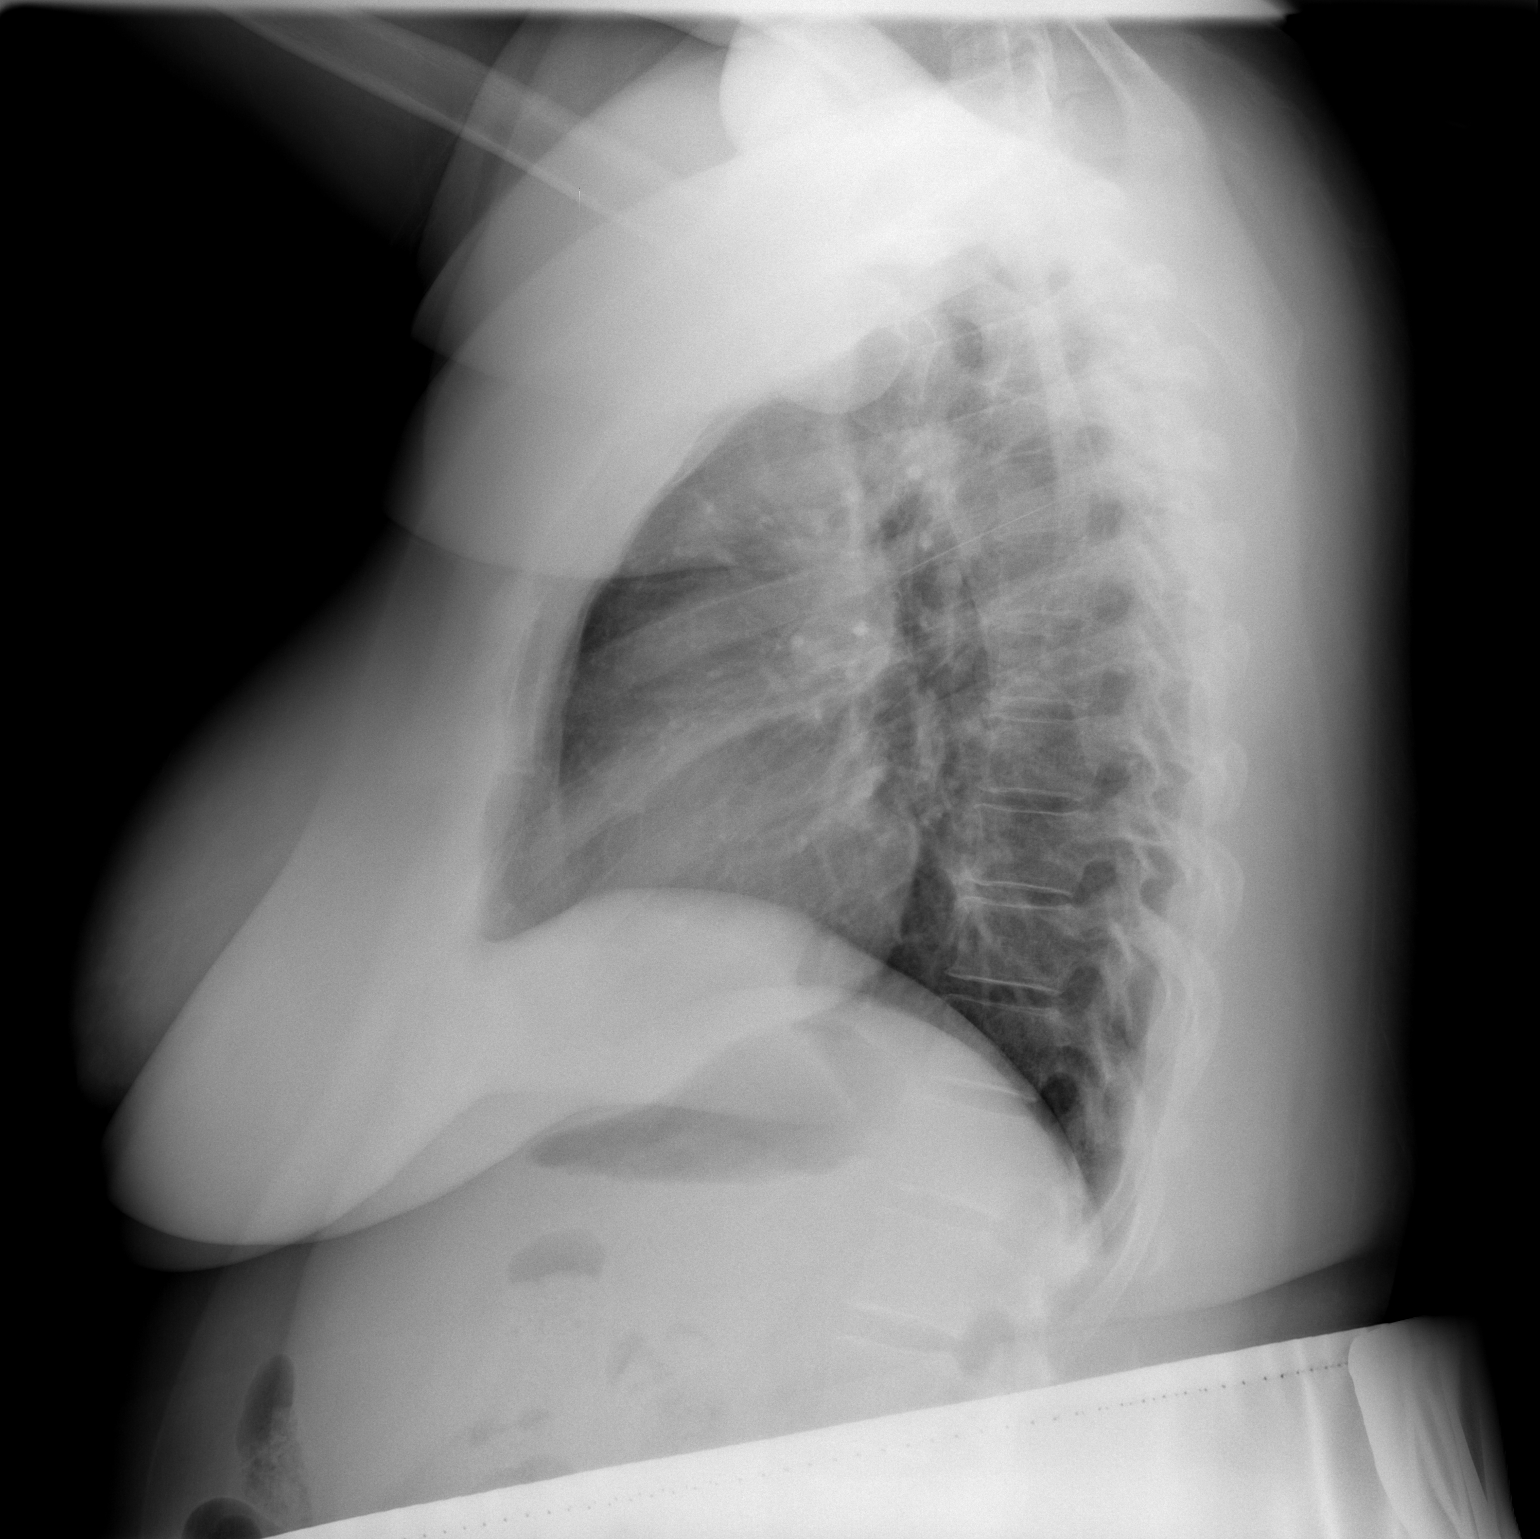

[2 of 2 positions shown; findings below may reference images not displayed]

FINDINGS: No active infiltrate or effusion is seen. Mediastinal contours
appear normal. The heart is within normal limits in size. No bony
abnormality is seen.
IMPRESSION: No active cardiopulmonary disease.

## 2016-06-16 DIAGNOSIS — R52 Pain, unspecified: Secondary | ICD-10-CM | POA: Diagnosis not present

## 2016-06-16 DIAGNOSIS — J45991 Cough variant asthma: Secondary | ICD-10-CM | POA: Diagnosis not present

## 2016-06-16 DIAGNOSIS — J45901 Unspecified asthma with (acute) exacerbation: Secondary | ICD-10-CM | POA: Diagnosis not present

## 2016-08-07 ENCOUNTER — Ambulatory Visit: Payer: Self-pay | Admitting: Psychology

## 2016-09-02 ENCOUNTER — Ambulatory Visit: Payer: Self-pay | Admitting: Psychology

## 2016-09-29 DIAGNOSIS — E039 Hypothyroidism, unspecified: Secondary | ICD-10-CM | POA: Diagnosis not present

## 2017-01-04 DIAGNOSIS — L237 Allergic contact dermatitis due to plants, except food: Secondary | ICD-10-CM | POA: Diagnosis not present

## 2017-04-01 DIAGNOSIS — E785 Hyperlipidemia, unspecified: Secondary | ICD-10-CM | POA: Diagnosis not present

## 2017-04-01 DIAGNOSIS — E039 Hypothyroidism, unspecified: Secondary | ICD-10-CM | POA: Diagnosis not present

## 2017-04-01 DIAGNOSIS — Z713 Dietary counseling and surveillance: Secondary | ICD-10-CM | POA: Diagnosis not present

## 2017-04-01 DIAGNOSIS — B351 Tinea unguium: Secondary | ICD-10-CM | POA: Diagnosis not present

## 2017-07-28 DIAGNOSIS — J0100 Acute maxillary sinusitis, unspecified: Secondary | ICD-10-CM | POA: Diagnosis not present

## 2017-12-09 DIAGNOSIS — E063 Autoimmune thyroiditis: Secondary | ICD-10-CM | POA: Diagnosis not present

## 2017-12-09 DIAGNOSIS — J029 Acute pharyngitis, unspecified: Secondary | ICD-10-CM | POA: Diagnosis not present

## 2017-12-09 DIAGNOSIS — R0989 Other specified symptoms and signs involving the circulatory and respiratory systems: Secondary | ICD-10-CM | POA: Diagnosis not present

## 2017-12-14 ENCOUNTER — Other Ambulatory Visit: Payer: Self-pay | Admitting: Physician Assistant

## 2017-12-14 DIAGNOSIS — E063 Autoimmune thyroiditis: Secondary | ICD-10-CM

## 2018-01-05 ENCOUNTER — Other Ambulatory Visit: Payer: Self-pay

## 2018-01-13 ENCOUNTER — Ambulatory Visit
Admission: RE | Admit: 2018-01-13 | Discharge: 2018-01-13 | Disposition: A | Discharge status: Home / Self Care | Payer: 59 | Source: Ambulatory Visit | Attending: Physician Assistant | Admitting: Physician Assistant

## 2018-01-13 DIAGNOSIS — E063 Autoimmune thyroiditis: Secondary | ICD-10-CM

## 2018-01-13 DIAGNOSIS — E079 Disorder of thyroid, unspecified: Secondary | ICD-10-CM | POA: Diagnosis not present

## 2018-01-14 ENCOUNTER — Other Ambulatory Visit: Payer: Self-pay

## 2018-02-03 DIAGNOSIS — Z91048 Other nonmedicinal substance allergy status: Secondary | ICD-10-CM | POA: Diagnosis not present

## 2018-02-03 DIAGNOSIS — R1310 Dysphagia, unspecified: Secondary | ICD-10-CM | POA: Diagnosis not present

## 2018-02-09 DIAGNOSIS — S161XXA Strain of muscle, fascia and tendon at neck level, initial encounter: Secondary | ICD-10-CM | POA: Diagnosis not present

## 2018-06-23 DIAGNOSIS — E039 Hypothyroidism, unspecified: Secondary | ICD-10-CM | POA: Diagnosis not present

## 2018-06-23 DIAGNOSIS — E559 Vitamin D deficiency, unspecified: Secondary | ICD-10-CM | POA: Diagnosis not present

## 2019-01-20 ENCOUNTER — Encounter: Payer: Self-pay | Admitting: Physical Therapy

## 2019-01-20 ENCOUNTER — Other Ambulatory Visit: Payer: Self-pay

## 2019-01-20 ENCOUNTER — Ambulatory Visit: Payer: 59 | Attending: Family Medicine | Admitting: Physical Therapy

## 2019-01-20 DIAGNOSIS — M5441 Lumbago with sciatica, right side: Secondary | ICD-10-CM | POA: Insufficient documentation

## 2019-01-20 DIAGNOSIS — M6281 Muscle weakness (generalized): Secondary | ICD-10-CM | POA: Diagnosis present

## 2019-01-20 DIAGNOSIS — M5442 Lumbago with sciatica, left side: Secondary | ICD-10-CM | POA: Diagnosis present

## 2019-01-20 DIAGNOSIS — G8929 Other chronic pain: Secondary | ICD-10-CM
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
## 2019-01-20 NOTE — Patient Instructions (Signed)
Access Code: [email or web address]  URL: [email or web address]  Date: 01/20/2019  Prepared by: Ruben Im   Exercises  Prone Press Up - 5 reps - 1 sets - 1 hold - 7x daily - 7x weekly  Standing Lumbar Extension - 5 reps - 1 sets - 1 hold - 7x daily - 7x weekly    Sleep hygiene

## 2019-01-23 ENCOUNTER — Ambulatory Visit: Payer: 59 | Admitting: Physical Therapy

## 2019-01-23 ENCOUNTER — Encounter: Payer: Self-pay | Admitting: Physical Therapy

## 2019-01-23 ENCOUNTER — Other Ambulatory Visit: Payer: Self-pay

## 2019-01-23 DIAGNOSIS — M6281 Muscle weakness (generalized): Secondary | ICD-10-CM

## 2019-01-23 DIAGNOSIS — M5442 Lumbago with sciatica, left side: Secondary | ICD-10-CM

## 2019-01-23 DIAGNOSIS — G8929 Other chronic pain: Secondary | ICD-10-CM

## 2019-01-23 NOTE — Patient Instructions (Signed)
Access Code: [email or web address]  URL: [email or web address]  Date: 01/23/2019  Prepared by: Venetia Night Doyel Mulkern   Exercises  Prone Press Up - 5 reps - 1 sets - 1 hold - 7x daily - 7x weekly  Standing Lumbar Extension - 5 reps - 1 sets - 1 hold - 7x daily - 7x weekly  Cat-Camel - 10 reps - 3 sets - 1x daily - 7x weekly  Bird Dog - 10 reps - 2 sets - 1x daily - 7x weekly
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
## 2019-01-25 ENCOUNTER — Encounter: Payer: Self-pay | Admitting: Physical Therapy

## 2019-01-25 ENCOUNTER — Other Ambulatory Visit: Payer: Self-pay

## 2019-01-25 ENCOUNTER — Ambulatory Visit: Payer: 59 | Admitting: Physical Therapy

## 2019-01-25 DIAGNOSIS — M5442 Lumbago with sciatica, left side: Secondary | ICD-10-CM | POA: Diagnosis not present

## 2019-01-25 DIAGNOSIS — M6281 Muscle weakness (generalized): Secondary | ICD-10-CM

## 2019-01-25 DIAGNOSIS — G8929 Other chronic pain: Secondary | ICD-10-CM
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
## 2019-01-30 ENCOUNTER — Ambulatory Visit: Payer: 59 | Admitting: Physical Therapy

## 2019-01-30 ENCOUNTER — Other Ambulatory Visit: Payer: Self-pay

## 2019-01-30 DIAGNOSIS — M5442 Lumbago with sciatica, left side: Secondary | ICD-10-CM | POA: Diagnosis not present

## 2019-01-30 DIAGNOSIS — G8929 Other chronic pain: Secondary | ICD-10-CM

## 2019-01-30 DIAGNOSIS — M6281 Muscle weakness (generalized): Secondary | ICD-10-CM
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
## 2019-02-01 ENCOUNTER — Encounter: Payer: 59 | Admitting: Physical Therapy

## 2019-02-02 ENCOUNTER — Ambulatory Visit: Payer: 59 | Admitting: Physical Therapy

## 2019-02-06 ENCOUNTER — Other Ambulatory Visit: Payer: Self-pay

## 2019-02-06 ENCOUNTER — Ambulatory Visit: Payer: 59 | Admitting: Physical Therapy

## 2019-02-06 ENCOUNTER — Encounter: Payer: Self-pay | Admitting: Physical Therapy

## 2019-02-06 DIAGNOSIS — G8929 Other chronic pain: Secondary | ICD-10-CM

## 2019-02-06 DIAGNOSIS — M5442 Lumbago with sciatica, left side: Secondary | ICD-10-CM

## 2019-02-06 DIAGNOSIS — M6281 Muscle weakness (generalized): Secondary | ICD-10-CM
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
## 2019-02-08 ENCOUNTER — Ambulatory Visit: Payer: 59 | Admitting: Physical Therapy

## 2019-02-08 ENCOUNTER — Encounter: Payer: Self-pay | Admitting: Physical Therapy

## 2019-02-08 ENCOUNTER — Other Ambulatory Visit: Payer: Self-pay

## 2019-02-08 DIAGNOSIS — M6281 Muscle weakness (generalized): Secondary | ICD-10-CM

## 2019-02-08 DIAGNOSIS — G8929 Other chronic pain: Secondary | ICD-10-CM

## 2019-02-08 DIAGNOSIS — M5442 Lumbago with sciatica, left side: Secondary | ICD-10-CM | POA: Diagnosis not present
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
## 2019-02-08 NOTE — Patient Instructions (Signed)
Access Code: [email or web address]  URL: [email or web address]  Date: 02/08/2019  Prepared by: Venetia Night Beuhring   Exercises  Prone Press Up - 5 reps - 1 sets - 1 hold - 7x daily - 7x weekly  Standing Lumbar Extension - 5 reps - 1 sets - 1 hold - 7x daily - 7x weekly  Cat-Camel - 10 reps - 3 sets - 1x daily - 7x weekly  Bird Dog - 10 reps - 2 sets - 1x daily - 7x weekly  Shoulder Extension with Resistance - 10 reps - 3 sets - 1x daily - 7x weekly  Standing Row with Anchored Resistance - 10 reps - 3 sets - 1x daily - 7x weekly  Single Leg Balance in March Position - 3 reps - 3 sets - 30 hold - 1x daily - 7x weekly  Wall Squat with Swiss Ball - 10 reps - 1 sets - 1x daily - 7x weekly

## 2019-02-13 ENCOUNTER — Ambulatory Visit: Payer: 59 | Admitting: Physical Therapy

## 2019-02-13 ENCOUNTER — Telehealth: Payer: Self-pay | Admitting: Physical Therapy

## 2019-02-13 NOTE — Telephone Encounter (Signed)
Spoke to patient about missed appointment today 02/13/19 at 1:30pm.  She said she lost track of time and confirmed she will be at next PT visit on Friday. Johanna Beuhring, PT 02/13/19 1:49 PM

## 2019-02-17 ENCOUNTER — Other Ambulatory Visit: Payer: Self-pay

## 2019-02-17 ENCOUNTER — Ambulatory Visit: Payer: 59 | Attending: Family Medicine | Admitting: Physical Therapy

## 2019-02-17 ENCOUNTER — Encounter: Payer: Self-pay | Admitting: Physical Therapy

## 2019-02-17 DIAGNOSIS — M5442 Lumbago with sciatica, left side: Secondary | ICD-10-CM | POA: Insufficient documentation

## 2019-02-17 DIAGNOSIS — M5441 Lumbago with sciatica, right side: Secondary | ICD-10-CM | POA: Diagnosis present

## 2019-02-17 DIAGNOSIS — G8929 Other chronic pain: Secondary | ICD-10-CM | POA: Insufficient documentation

## 2019-02-17 DIAGNOSIS — M6281 Muscle weakness (generalized): Secondary | ICD-10-CM | POA: Diagnosis present

## 2019-02-17 NOTE — Patient Instructions (Signed)
Access Code: [email or web address], added green band SLS vectors with LEs and sidestepping with band around knees
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
## 2019-02-22 ENCOUNTER — Encounter: Payer: 59 | Admitting: Physical Therapy

## 2019-02-24 ENCOUNTER — Encounter: Payer: 59 | Admitting: Physical Therapy

## 2019-02-27 ENCOUNTER — Encounter: Payer: 59 | Admitting: Physical Therapy

## 2019-03-01 ENCOUNTER — Encounter: Payer: 59 | Admitting: Physical Therapy

## 2019-03-03 ENCOUNTER — Ambulatory Visit: Payer: 59 | Admitting: Physical Therapy

## 2019-03-06 ENCOUNTER — Encounter: Payer: 59 | Admitting: Physical Therapy

## 2019-03-07 ENCOUNTER — Ambulatory Visit: Payer: 59 | Admitting: Physical Therapy

## 2019-03-08 ENCOUNTER — Encounter: Payer: 59 | Admitting: Physical Therapy

## 2019-03-10 ENCOUNTER — Other Ambulatory Visit: Payer: Self-pay

## 2019-03-10 ENCOUNTER — Encounter: Payer: Self-pay | Admitting: Physical Therapy

## 2019-03-10 ENCOUNTER — Ambulatory Visit: Payer: 59 | Admitting: Physical Therapy

## 2019-03-10 DIAGNOSIS — G8929 Other chronic pain: Secondary | ICD-10-CM

## 2019-03-10 DIAGNOSIS — M6281 Muscle weakness (generalized): Secondary | ICD-10-CM

## 2019-03-10 DIAGNOSIS — M5442 Lumbago with sciatica, left side: Secondary | ICD-10-CM | POA: Diagnosis not present
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
## 2019-03-13 ENCOUNTER — Encounter: Payer: 59 | Admitting: Physical Therapy

## 2019-03-15 ENCOUNTER — Encounter: Payer: 59 | Admitting: Physical Therapy

## 2019-03-17 ENCOUNTER — Ambulatory Visit: Payer: 59 | Admitting: Physical Therapy

## 2019-03-24 ENCOUNTER — Ambulatory Visit: Payer: 59 | Admitting: Physical Therapy

## 2019-03-31 ENCOUNTER — Encounter: Payer: 59 | Admitting: Physical Therapy

## 2019-04-07 ENCOUNTER — Encounter: Payer: 59 | Admitting: Physical Therapy

## 2019-04-14 ENCOUNTER — Encounter: Payer: 59 | Admitting: Physical Therapy

## 2020-10-31 ENCOUNTER — Ambulatory Visit (INDEPENDENT_AMBULATORY_CARE_PROVIDER_SITE_OTHER): Payer: 59 | Admitting: Bariatrics

## 2020-10-31 ENCOUNTER — Encounter (INDEPENDENT_AMBULATORY_CARE_PROVIDER_SITE_OTHER): Payer: Self-pay | Admitting: Bariatrics

## 2020-10-31 ENCOUNTER — Other Ambulatory Visit: Payer: Self-pay

## 2020-10-31 VITALS — BP 152/92 | HR 76 | Temp 98.1°F | Ht 63.0 in | Wt 268.0 lb

## 2020-10-31 DIAGNOSIS — E038 Other specified hypothyroidism: Secondary | ICD-10-CM

## 2020-10-31 DIAGNOSIS — Z6841 Body Mass Index (BMI) 40.0 and over, adult: Secondary | ICD-10-CM

## 2020-10-31 DIAGNOSIS — M797 Fibromyalgia: Secondary | ICD-10-CM | POA: Diagnosis not present

## 2020-10-31 DIAGNOSIS — R7309 Other abnormal glucose: Secondary | ICD-10-CM

## 2020-10-31 DIAGNOSIS — R0602 Shortness of breath: Secondary | ICD-10-CM

## 2020-10-31 DIAGNOSIS — R5383 Other fatigue: Secondary | ICD-10-CM

## 2020-10-31 DIAGNOSIS — Z1331 Encounter for screening for depression: Secondary | ICD-10-CM | POA: Diagnosis not present

## 2020-10-31 DIAGNOSIS — Z9189 Other specified personal risk factors, not elsewhere classified: Secondary | ICD-10-CM | POA: Diagnosis not present

## 2020-10-31 DIAGNOSIS — Z0289 Encounter for other administrative examinations: Secondary | ICD-10-CM

## 2020-10-31 DIAGNOSIS — E538 Deficiency of other specified B group vitamins: Secondary | ICD-10-CM

## 2020-10-31 DIAGNOSIS — E559 Vitamin D deficiency, unspecified: Secondary | ICD-10-CM

## 2020-10-31 DIAGNOSIS — R030 Elevated blood-pressure reading, without diagnosis of hypertension: Secondary | ICD-10-CM

## 2020-11-01 LAB — COMPREHENSIVE METABOLIC PANEL
ALT: 18 IU/L (ref 0–32)
AST: 16 IU/L (ref 0–40)
Albumin/Globulin Ratio: 1.8 (ref 1.2–2.2)
Albumin: 4.4 g/dL (ref 3.8–4.8)
Alkaline Phosphatase: 68 IU/L (ref 44–121)
BUN/Creatinine Ratio: 12 (ref 9–23)
BUN: 12 mg/dL (ref 6–20)
Bilirubin Total: 0.5 mg/dL (ref 0.0–1.2)
CO2: 23 mmol/L (ref 20–29)
Calcium: 8.8 mg/dL (ref 8.7–10.2)
Chloride: 103 mmol/L (ref 96–106)
Creatinine, Ser: 0.97 mg/dL (ref 0.57–1.00)
Globulin, Total: 2.4 g/dL (ref 1.5–4.5)
Glucose: 105 mg/dL — ABNORMAL HIGH (ref 65–99)
Potassium: 4.2 mmol/L (ref 3.5–5.2)
Sodium: 141 mmol/L (ref 134–144)
Total Protein: 6.8 g/dL (ref 6.0–8.5)
eGFR: 76 mL/min/{1.73_m2} (ref >59)

## 2020-11-01 LAB — VITAMIN D 25 HYDROXY (VIT D DEFICIENCY, FRACTURES): Vit D, 25-Hydroxy: 17.7 ng/mL — ABNORMAL LOW (ref 30.0–100.0)

## 2020-11-01 LAB — VITAMIN B12: Vitamin B-12: 255 pg/mL (ref 232–1245)

## 2020-11-01 LAB — CBC WITH DIFFERENTIAL/PLATELET
Basophils Absolute: 0 10*3/uL (ref 0.0–0.2)
Basos: 0 %
EOS (ABSOLUTE): 0.2 10*3/uL (ref 0.0–0.4)
Eos: 3 %
Hematocrit: 29 % — ABNORMAL LOW (ref 34.0–46.6)
Hemoglobin: 8.6 g/dL — ABNORMAL LOW (ref 11.1–15.9)
Immature Grans (Abs): 0 10*3/uL (ref 0.0–0.1)
Immature Granulocytes: 0 %
Lymphocytes Absolute: 1.2 10*3/uL (ref 0.7–3.1)
Lymphs: 20 %
MCH: 20 pg — ABNORMAL LOW (ref 26.6–33.0)
MCHC: 29.7 g/dL — ABNORMAL LOW (ref 31.5–35.7)
MCV: 68 fL — ABNORMAL LOW (ref 79–97)
Monocytes Absolute: 0.4 10*3/uL (ref 0.1–0.9)
Monocytes: 6 %
Neutrophils Absolute: 4.3 10*3/uL (ref 1.4–7.0)
Neutrophils: 71 %
Platelets: 326 10*3/uL (ref 150–450)
RBC: 4.29 x10E6/uL (ref 3.77–5.28)
RDW: 17 % — ABNORMAL HIGH (ref 11.7–15.4)
WBC: 6.1 10*3/uL (ref 3.4–10.8)

## 2020-11-01 LAB — T3: T3, Total: 108 ng/dL (ref 71–180)

## 2020-11-01 LAB — LIPID PANEL WITH LDL/HDL RATIO
Cholesterol, Total: 224 mg/dL — ABNORMAL HIGH (ref 100–199)
HDL: 42 mg/dL (ref >39)
LDL Chol Calc (NIH): 159 mg/dL — ABNORMAL HIGH (ref 0–99)
LDL/HDL Ratio: 3.8 ratio — ABNORMAL HIGH (ref 0.0–3.2)
Triglycerides: 126 mg/dL (ref 0–149)
VLDL Cholesterol Cal: 23 mg/dL (ref 5–40)

## 2020-11-01 LAB — HEMOGLOBIN A1C
Est. average glucose Bld gHb Est-mCnc: 137 mg/dL
Hgb A1c MFr Bld: 6.4 % — ABNORMAL HIGH (ref 4.8–5.6)

## 2020-11-01 LAB — T4, FREE: Free T4: 0.99 ng/dL (ref 0.82–1.77)

## 2020-11-01 LAB — TSH: TSH: 3.13 u[IU]/mL (ref 0.450–4.500)

## 2020-11-01 LAB — INSULIN, RANDOM: INSULIN: 27.4 u[IU]/mL — ABNORMAL HIGH (ref 2.6–24.9)

## 2020-11-04 ENCOUNTER — Telehealth (INDEPENDENT_AMBULATORY_CARE_PROVIDER_SITE_OTHER): Payer: Self-pay | Admitting: Bariatrics

## 2020-11-04 NOTE — Telephone Encounter (Signed)
Phone call to pt regarding lab work. Left message for patient to call the office.

## 2020-11-05 ENCOUNTER — Encounter (INDEPENDENT_AMBULATORY_CARE_PROVIDER_SITE_OTHER): Payer: Self-pay | Admitting: Bariatrics

## 2020-11-05 NOTE — Progress Notes (Signed)
Dear Dr. Docia Chuck,   Thank you for referring Pamela Jennings to our clinic. The following note includes my evaluation and treatment recommendations.  Chief Complaint:   OBESITY Pamela Jennings (MR# [id number]) is a 40 y.o. female who presents for evaluation and treatment of obesity and related comorbidities. Current BMI is Body mass index is 47.47 kg/m. Pamela Jennings has been struggling with her weight for many years and has been unsuccessful in either losing weight, maintaining weight loss, or reaching her healthy weight goal.  Pamela Jennings is currently in the action stage of change and ready to dedicate time achieving and maintaining a healthier weight. Pamela Jennings is interested in becoming our patient and working on intensive lifestyle modifications including (but not limited to) diet and exercise for weight loss.  Pamela Jennings likes to cook but notes obstacles. She states that she is a picky obstacles.  Pamela Jennings's habits were reviewed today and are as follows: she thinks her family will eat healthier with her, her desired weight loss is 118 lbs, she started gaining weight with pregnancy, her heaviest weight ever was 268 pounds, she is a picky eater and doesn't like to eat healthier foods, she skips meals frequently, she is frequently drinking liquids with calories, she frequently makes poor food choices and she struggles with emotional eating.  Depression Screen Pamela Jennings's Food and Mood (modified PHQ-9) Jennings was 18.  Depression screen PHQ 2/9 10/31/2020  Decreased Interest 2  Down, Depressed, Hopeless 2  PHQ - 2 Jennings 4  Altered sleeping 2  Tired, decreased energy 2  Change in appetite 2  Feeling bad or failure about yourself  2  Trouble concentrating 2  Moving slowly or fidgety/restless 2  Suicidal thoughts 2  PHQ-9 Jennings 18  Difficult doing work/chores Not difficult at all   Subjective:   1. Other fatigue Pamela Jennings admits to daytime somnolence and admits to waking up still tired. Patent has a  history of symptoms of daytime fatigue and morning fatigue. Pamela Jennings generally gets 6 hours of sleep per night, and states that she has poor sleep quality. Pamela Jennings is present. [name] episodes are present. Pamela Jennings is 8.  2. SOB (shortness of breath) on exertion Pamela Jennings notes increasing shortness of breath with exercising and seems to be worsening over time with weight gain. She notes getting out of breath sooner with activity than she used to. This has gotten worse recently. Pamela Jennings denies shortness of breath at rest or orthopnea.  3. Other specified hypothyroidism Pamela Jennings is not on medication. Pt states her thyroid is normal.  4. Fibromyalgia Pain threshold zero per ptl . Envi is not on medication.  5. Vitamin D deficiency Pamela Jennings is taking an OTC Vit D supplement.  6. Elevated blood pressure reading She does not have a diagnosis of HTN. She denies chest pain on exertion, no significant  dyspnea on exertion, no swelling of ankles.   BP Readings from Last 3 Encounters:  10/31/20 (!) 152/92  05/05/13 119/75  09/01/12 113/73   7. Elevated glucose [name] has a history of diabetes.  8. Vitamin B 12 deficiency Pamela Jennings is not on a B12 supplement.  9. At risk for activity intolerance Pamela Jennings is at risk for exercise intolerance due to obesity and fibromyalgia.  Assessment/Plan:   1. Other fatigue Halee does feel that her weight is causing her energy to be lower than it should be. Fatigue may be related to obesity, depression or many other causes. Labs will be ordered, and in the meanwhile, Pamela Jennings  will focus on self care including making healthy food choices, increasing physical activity and focusing on stress reduction. Check labs today.  - EKG 12-Lead - Vitamin B12 - Hemoglobin A1c - Insulin, random  2. SOB (shortness of breath) on exertion Pamela Jennings does feel that she gets out of breath more easily that she used to when she exercises. Pamela Jennings's shortness of  breath appears to be obesity related and exercise induced. She has agreed to work on weight loss and gradually increase exercise to treat her exercise induced shortness of breath. Will continue to monitor closely.  3. Other specified hypothyroidism Patient with long-standing hypothyroidism, on levothyroxine therapy. She appears euthyroid. Orders and follow up as documented in patient record. Will continue to monitor.  Counseling . Good thyroid control is important for overall health. Supratherapeutic thyroid levels are dangerous and will not improve weight loss results. . The correct way to take levothyroxine is fasting, with water, separated by at least 30 minutes from breakfast, and separated by more than 4 hours from calcium, iron, multivitamins, acid reflux medications (PPIs).   4. Fibromyalgia Gradually increase activity.  5. Vitamin D deficiency Low Vitamin D level contributes to fatigue and are associated with obesity, breast, and colon cancer. She agrees to continue to take OTC Vitamin D and will follow-up for routine testing of Vitamin D, at least 2-3 times per year to avoid over-replacement.   6. Elevated blood pressure reading  Pamela Jennings is working on healthy weight loss and exercise to improve blood pressure control. We will watch for signs of hypotension as she continues her lifestyle modifications.  7. Elevated glucose Fasting labs will be obtained and results with be discussed with Pamela Jennings in 2 weeks at her follow up visit. In the meanwhile Pamela Jennings was started on a lower simple carbohydrate diet and will work on weight loss efforts. Check labs today.  - Hemoglobin A1c - Insulin, random  8. Vitamin B 12 deficiency The diagnosis was reviewed with the patient. Counseling provided today, see below. We will continue to monitor. Orders and follow up as documented in patient record. Check labs today.  Counseling . The body needs vitamin B12: to make red blood cells; to make DNA;  and to help the nerves work properly so they can carry messages from the brain to the body.  . The main causes of vitamin B12 deficiency include dietary deficiency, digestive diseases, pernicious anemia, and having a surgery in which part of the stomach or small intestine is removed.  . Certain medicines can make it harder for the body to absorb vitamin B12. These medicines include: heartburn medications; some antibiotics; some medications used to treat diabetes, gout, and high cholesterol.  . In some cases, there are no symptoms of this condition. If the condition leads to anemia or nerve damage, various symptoms can occur, such as weakness or fatigue, shortness of breath, and numbness or tingling in your hands and feet.   . Treatment:  o May include taking vitamin B12 supplements.  o Avoid alcohol.  o Eat lots of healthy foods that contain vitamin B12: - Beef, pork, chicken, [place], and organ meats, such as liver.  - Seafood: This includes clams, rainbow trout, salmon, tuna, and haddock. Eggs.  - Cereal and dairy products that are fortified: This means that vitamin B12 has been added to the food.  - Vitamin B12  9. Depression screen Merida had a positive depression screening. Depression is commonly associated with obesity and often results in emotional eating behaviors. We  will monitor this closely and work on CBT to help improve the non-hunger eating patterns. Referral to Psychology may be required if no improvement is seen as she continues in our clinic.  10. At risk for activity intolerance Mathilde was given approximately 15 minutes of exercise intolerance counseling today. She is 40 y.o. female and has risk factors exercise intolerance including obesity. We discussed intensive lifestyle modifications today with an emphasis on specific weight loss instructions and strategies. Jeyda will slowly increase activity as tolerated.  Repetitive spaced learning was employed today to elicit superior  memory formation and behavioral change.  11. Obesity, current BMI 47 Arliene is currently in the action stage of change and her goal is to continue with weight loss efforts. I recommend Jenell begin the structured treatment plan as follows:  She has agreed to the Category 1 Plan and the Pescatarian Plan + 100 calories.  Meal plan Intentional eating Stop skipping meals  Exercise goals: Walk, stretch, exercise bands   Behavioral modification strategies: increasing lean protein intake, decreasing simple carbohydrates, increasing vegetables, increasing water intake, decreasing eating out, no skipping meals, meal planning and cooking strategies, keeping healthy foods in the home and planning for success.  She was informed of the importance of frequent follow-up visits to maximize her success with intensive lifestyle modifications for her multiple health conditions. She was informed we would discuss her lab results at her next visit unless there is a critical issue that needs to be addressed sooner. Teyana agreed to keep her next visit at the agreed upon time to discuss these results.  Objective:   Blood pressure (!) 152/92, pulse 76, temperature 98.1 F (36.7 C), height 5\' 3"  (1.6 m), weight 268 lb (121.6 kg), SpO2 99 %, unknown if currently breastfeeding. Body mass index is 47.47 kg/m.  EKG: Normal sinus rhythm, rate 75- Nonspecific T-abnormality.  Indirect Calorimeter completed today shows a VO2 of 182 and a REE of 1266.  Her calculated basal metabolic rate is [id number] thus her basal metabolic rate is worse than expected.  General: Cooperative, alert, well developed, in no acute distress. HEENT: Conjunctivae and lids unremarkable. Cardiovascular: Regular rhythm.  Lungs: Normal work of breathing. Neurologic: No focal deficits.   Lab Results  Component Value Date   CREATININE 0.97 10/31/2020   BUN 12 10/31/2020   NA 141 10/31/2020   K 4.2 10/31/2020   CL 103 10/31/2020   CO2 23  10/31/2020   Lab Results  Component Value Date   ALT 18 10/31/2020   AST 16 10/31/2020   ALKPHOS 68 10/31/2020   BILITOT 0.5 10/31/2020   Lab Results  Component Value Date   HGBA1C 6.4 (H) 10/31/2020   Lab Results  Component Value Date   INSULIN 27.4 (H) 10/31/2020   Lab Results  Component Value Date   TSH 3.130 10/31/2020   Lab Results  Component Value Date   CHOL 224 (H) 10/31/2020   HDL 42 10/31/2020   LDLCALC 159 (H) 10/31/2020   TRIG 126 10/31/2020   Lab Results  Component Value Date   WBC 6.1 10/31/2020   HGB 8.6 (L) 10/31/2020   HCT 29.0 (L) 10/31/2020   MCV 68 (L) 10/31/2020   PLT 326 10/31/2020    Attestation Statements:   Reviewed by clinician on day of visit: allergies, medications, problem list, medical history, surgical history, family history, social history, and previous encounter notes.  Edmund HildaI, Tamesha Frazier, CMA, am acting as Energy managertranscriptionist for [employer] Lino Wickliff, DO.  I have reviewed the  above documentation for accuracy and completeness, and I agree with the above. Corinna Capra, DO

## 2020-11-05 NOTE — Progress Notes (Signed)
Left msg for pt to call Dr. Manson Passey

## 2020-11-06 NOTE — Progress Notes (Signed)
Called pt, a man answered and said it is the wrong number, I verified the phone number, still wrong. Cell number does not work either.  Called work number listed and left msg.

## 2020-11-14 ENCOUNTER — Ambulatory Visit (INDEPENDENT_AMBULATORY_CARE_PROVIDER_SITE_OTHER): Payer: Self-pay | Admitting: Bariatrics

## 2020-11-27 ENCOUNTER — Encounter (INDEPENDENT_AMBULATORY_CARE_PROVIDER_SITE_OTHER): Payer: Self-pay | Admitting: Family Medicine

## 2020-12-19 NOTE — Progress Notes (Signed)
[facility], explained reason for ca.., was transferred to the referral coordinator. I left detailedmsg with phone number to contact me.

## 2021-08-25 NOTE — Progress Notes (Signed)
error 

## 2021-09-08 ENCOUNTER — Other Ambulatory Visit (HOSPITAL_COMMUNITY)
Admission: RE | Admit: 2021-09-08 | Discharge: 2021-09-08 | Disposition: A | Discharge status: Home / Self Care | Payer: 59 | Source: Ambulatory Visit | Attending: Nurse Practitioner | Admitting: Nurse Practitioner

## 2021-09-08 ENCOUNTER — Other Ambulatory Visit: Payer: Self-pay | Admitting: Nurse Practitioner

## 2021-09-08 DIAGNOSIS — N939 Abnormal uterine and vaginal bleeding, unspecified: Secondary | ICD-10-CM | POA: Diagnosis present

## 2021-09-08 DIAGNOSIS — Z124 Encounter for screening for malignant neoplasm of cervix: Secondary | ICD-10-CM | POA: Insufficient documentation

## 2021-09-11 LAB — CYTOLOGY - PAP
Comment: NEGATIVE
Diagnosis: NEGATIVE
High risk HPV: NEGATIVE

## 2022-08-25 ENCOUNTER — Telehealth (INDEPENDENT_AMBULATORY_CARE_PROVIDER_SITE_OTHER): Payer: Self-pay

## 2022-08-25 NOTE — Telephone Encounter (Signed)
Error
